# Patient Record
Sex: Female | Born: 1960
Health system: Southern US, Community
[De-identification: ages and names within clinical notes are randomized; demographics above are authoritative.]

## PROBLEM LIST (undated history)

## (undated) DIAGNOSIS — K449 Diaphragmatic hernia without obstruction or gangrene: Secondary | ICD-10-CM

## (undated) DIAGNOSIS — M1990 Unspecified osteoarthritis, unspecified site: Secondary | ICD-10-CM

## (undated) DIAGNOSIS — I313 Pericardial effusion (noninflammatory): Secondary | ICD-10-CM

## (undated) DIAGNOSIS — K219 Gastro-esophageal reflux disease without esophagitis: Secondary | ICD-10-CM

## (undated) DIAGNOSIS — D259 Leiomyoma of uterus, unspecified: Secondary | ICD-10-CM

## (undated) DIAGNOSIS — N281 Cyst of kidney, acquired: Secondary | ICD-10-CM

## (undated) DIAGNOSIS — N3281 Overactive bladder: Secondary | ICD-10-CM

## (undated) DIAGNOSIS — E785 Hyperlipidemia, unspecified: Secondary | ICD-10-CM

## (undated) DIAGNOSIS — K648 Other hemorrhoids: Secondary | ICD-10-CM

## (undated) HISTORY — DX: Hyperlipidemia, unspecified: E78.5

## (undated) HISTORY — DX: Diaphragmatic hernia without obstruction or gangrene: K44.9

## (undated) HISTORY — DX: Leiomyoma of uterus, unspecified: D25.9

## (undated) HISTORY — PX: WISDOM TOOTH EXTRACTION: SHX21

## (undated) HISTORY — PX: TONSILLECTOMY AND ADENOIDECTOMY: SHX28

## (undated) HISTORY — DX: Pericardial effusion (noninflammatory): I31.3

## (undated) HISTORY — DX: Overactive bladder: N32.81

## (undated) HISTORY — DX: Other hemorrhoids: K64.8

## (undated) HISTORY — DX: Gastro-esophageal reflux disease without esophagitis: K21.9

## (undated) HISTORY — PX: HEMORRHOID BANDING: SHX5850

## (undated) HISTORY — DX: Cyst of kidney, acquired: N28.1

## (undated) HISTORY — DX: Unspecified osteoarthritis, unspecified site: M19.90

## (undated) HISTORY — PX: COLONOSCOPY: SHX174

---

## 1997-10-20 ENCOUNTER — Other Ambulatory Visit: Admission: RE | Admit: 1997-10-20 | Discharge: 1997-10-20 | Payer: Self-pay | Admitting: Obstetrics and Gynecology

## 1999-02-10 ENCOUNTER — Other Ambulatory Visit: Admission: RE | Admit: 1999-02-10 | Discharge: 1999-02-10 | Payer: Self-pay | Admitting: Obstetrics and Gynecology

## 2000-03-28 ENCOUNTER — Other Ambulatory Visit: Admission: RE | Admit: 2000-03-28 | Discharge: 2000-03-28 | Payer: Self-pay | Admitting: Obstetrics and Gynecology

## 2001-07-23 ENCOUNTER — Other Ambulatory Visit: Admission: RE | Admit: 2001-07-23 | Discharge: 2001-07-23 | Payer: Self-pay | Admitting: Obstetrics and Gynecology

## 2002-02-11 ENCOUNTER — Encounter: Payer: Self-pay | Admitting: Obstetrics and Gynecology

## 2002-02-11 ENCOUNTER — Ambulatory Visit (HOSPITAL_COMMUNITY): Admission: RE | Admit: 2002-02-11 | Discharge: 2002-02-11 | Payer: Self-pay | Admitting: Obstetrics and Gynecology

## 2002-08-04 ENCOUNTER — Other Ambulatory Visit: Admission: RE | Admit: 2002-08-04 | Discharge: 2002-08-04 | Payer: Self-pay | Admitting: Obstetrics and Gynecology

## 2002-08-11 ENCOUNTER — Encounter: Admission: RE | Admit: 2002-08-11 | Discharge: 2002-08-11 | Payer: Self-pay | Admitting: Obstetrics and Gynecology

## 2002-08-11 ENCOUNTER — Encounter: Payer: Self-pay | Admitting: Obstetrics and Gynecology

## 2003-09-03 ENCOUNTER — Other Ambulatory Visit: Admission: RE | Admit: 2003-09-03 | Discharge: 2003-09-03 | Payer: Self-pay | Admitting: Obstetrics and Gynecology

## 2003-10-01 ENCOUNTER — Encounter: Admission: RE | Admit: 2003-10-01 | Discharge: 2003-10-01 | Payer: Self-pay | Admitting: Obstetrics and Gynecology

## 2004-10-14 ENCOUNTER — Other Ambulatory Visit: Admission: RE | Admit: 2004-10-14 | Discharge: 2004-10-14 | Payer: Self-pay | Admitting: Obstetrics and Gynecology

## 2005-03-09 ENCOUNTER — Encounter: Admission: RE | Admit: 2005-03-09 | Discharge: 2005-03-09 | Payer: Self-pay | Admitting: Obstetrics and Gynecology

## 2005-11-17 ENCOUNTER — Other Ambulatory Visit: Admission: RE | Admit: 2005-11-17 | Discharge: 2005-11-17 | Payer: Self-pay | Admitting: Obstetrics & Gynecology

## 2006-03-12 ENCOUNTER — Encounter: Admission: RE | Admit: 2006-03-12 | Discharge: 2006-03-12 | Payer: Self-pay | Admitting: Obstetrics and Gynecology

## 2006-12-11 ENCOUNTER — Other Ambulatory Visit: Admission: RE | Admit: 2006-12-11 | Discharge: 2006-12-11 | Payer: Self-pay | Admitting: Obstetrics and Gynecology

## 2007-03-20 ENCOUNTER — Encounter: Admission: RE | Admit: 2007-03-20 | Discharge: 2007-03-20 | Payer: Self-pay | Admitting: Obstetrics and Gynecology

## 2007-12-23 ENCOUNTER — Other Ambulatory Visit: Admission: RE | Admit: 2007-12-23 | Discharge: 2007-12-23 | Payer: Self-pay | Admitting: Obstetrics and Gynecology

## 2008-03-30 ENCOUNTER — Encounter: Payer: Self-pay | Admitting: Obstetrics and Gynecology

## 2008-03-30 ENCOUNTER — Ambulatory Visit (HOSPITAL_COMMUNITY): Admission: RE | Admit: 2008-03-30 | Discharge: 2008-03-30 | Payer: Self-pay | Admitting: Obstetrics and Gynecology

## 2008-03-30 HISTORY — PX: HYSTEROSCOPY: SHX211

## 2008-04-07 ENCOUNTER — Encounter: Admission: RE | Admit: 2008-04-07 | Discharge: 2008-04-07 | Payer: Self-pay | Admitting: Obstetrics and Gynecology

## 2009-04-30 ENCOUNTER — Encounter: Admission: RE | Admit: 2009-04-30 | Discharge: 2009-04-30 | Payer: Self-pay | Admitting: Obstetrics and Gynecology

## 2010-05-09 ENCOUNTER — Encounter
Admission: RE | Admit: 2010-05-09 | Discharge: 2010-05-09 | Payer: Self-pay | Source: Home / Self Care | Attending: Obstetrics and Gynecology | Admitting: Obstetrics and Gynecology

## 2010-08-23 NOTE — Op Note (Signed)
NAME:  Laurie Griffith, Laurie Griffith               ACCOUNT NO.:  [id number]   MEDICAL RECORD NO.:  [id number]          PATIENT TYPE:  AMB   LOCATION:  SDC                           FACILITY:  [facility]   PHYSICIAN:  Cynthia P. Romine, M.D.DATE OF BIRTH:  1960/11/05   DATE OF PROCEDURE:  DATE OF DISCHARGE:                               OPERATIVE REPORT   PREOPERATIVE DIAGNOSES:  Abnormal uterine bleeding, known endometrial  polyp on sonohysterogram.   POSTOPERATIVE DIAGNOSIS:  Abnormal uterine bleeding, known endometrial  polyp on sonohysterogram.   PATHOLOGY:  Pending.   PROCEDURE:  Hysteroscopic resection of polyps and D and C.   SURGEON:  Cynthia P. Romine, MD   ANESTHESIA:  General by LMA.   ESTIMATED BLOOD LOSS:  Minimal.   SORBITOL DEFICIT:  65 mL.   PROCEDURE:  The patient was taken to the operating room, and after  induction of adequate general anesthesia by LMA was placed in dorsal  lithotomy position and prepped and draped in the usual fashion.  The  bladder was drained with a red rubber catheter.  A posterior weighted  and anterior Sims retractor were placed.  The cervix was grasped on its  anterior lip with a single-tooth tenaculum.  The uterus sounded to 8 cm.  The cervix was dilated to a #31 Pratt.  The operative hysteroscope was  introduced.  Sorbitol was used as a distention medium, and the pump was  set on a pressure of 80 mmHg.  Multiple endometrial polyps were noted as  had been seen on sonohysterogram.  Photographic documentation was taken.  Single loop cautery was used to resect the polyp in several passes, and  the specimen sent to Pathology.  When the endometrial cavity appeared  clean, photographic documentation was taken.  The scope was withdrawn.  Gentle sharp curettage was carried out, and the specimen was sent  separately to Pathology.  The instruments were removed from the vagina,  and the procedure was terminated.  The patient went in satisfactory  condition to  postanesthesia recovery.      Cynthia P. Romine, M.D.  Electronically Signed     CPR/MEDQ  D:  03/30/2008  T:  03/30/2008  Job:  [id number]

## 2011-01-13 LAB — CBC
MCV: 91.8 fL (ref 78.0–100.0)
RBC: 4.21 MIL/uL (ref 3.87–5.11)
RDW: 13.3 % (ref 11.5–15.5)
WBC: 7.2 10*3/uL (ref 4.0–10.5)

## 2011-01-13 LAB — HCG, SERUM, QUALITATIVE: Preg, Serum: NEGATIVE

## 2011-04-10 ENCOUNTER — Other Ambulatory Visit: Payer: Self-pay | Admitting: Obstetrics and Gynecology

## 2011-04-10 DIAGNOSIS — Z1231 Encounter for screening mammogram for malignant neoplasm of breast: Secondary | ICD-10-CM

## 2011-05-15 ENCOUNTER — Ambulatory Visit
Admission: RE | Admit: 2011-05-15 | Discharge: 2011-05-15 | Disposition: A | Discharge status: Home / Self Care | Payer: BC Managed Care – PPO | Source: Ambulatory Visit | Attending: Obstetrics and Gynecology | Admitting: Obstetrics and Gynecology

## 2011-05-15 DIAGNOSIS — Z1231 Encounter for screening mammogram for malignant neoplasm of breast: Secondary | ICD-10-CM

## 2011-09-09 DIAGNOSIS — K219 Gastro-esophageal reflux disease without esophagitis: Secondary | ICD-10-CM

## 2011-09-09 HISTORY — DX: Gastro-esophageal reflux disease without esophagitis: K21.9

## 2011-09-29 ENCOUNTER — Other Ambulatory Visit: Payer: Self-pay | Admitting: Gastroenterology

## 2011-09-29 DIAGNOSIS — R140 Abdominal distension (gaseous): Secondary | ICD-10-CM

## 2011-09-29 DIAGNOSIS — R109 Unspecified abdominal pain: Secondary | ICD-10-CM

## 2011-10-05 ENCOUNTER — Ambulatory Visit
Admission: RE | Admit: 2011-10-05 | Discharge: 2011-10-05 | Disposition: A | Discharge status: Home / Self Care | Payer: BC Managed Care – PPO | Source: Ambulatory Visit | Attending: Gastroenterology | Admitting: Gastroenterology

## 2011-10-05 DIAGNOSIS — R140 Abdominal distension (gaseous): Secondary | ICD-10-CM

## 2011-10-05 DIAGNOSIS — R109 Unspecified abdominal pain: Secondary | ICD-10-CM

## 2011-10-06 ENCOUNTER — Other Ambulatory Visit: Payer: BC Managed Care – PPO

## 2011-10-09 ENCOUNTER — Other Ambulatory Visit (HOSPITAL_COMMUNITY): Payer: Self-pay | Admitting: Gastroenterology

## 2011-10-09 ENCOUNTER — Other Ambulatory Visit (HOSPITAL_COMMUNITY): Payer: Self-pay

## 2011-10-09 DIAGNOSIS — R1084 Generalized abdominal pain: Secondary | ICD-10-CM

## 2011-10-13 ENCOUNTER — Other Ambulatory Visit (HOSPITAL_COMMUNITY): Payer: BC Managed Care – PPO

## 2011-10-18 ENCOUNTER — Other Ambulatory Visit: Payer: Self-pay | Admitting: Gastroenterology

## 2011-11-12 ENCOUNTER — Telehealth: Payer: Self-pay

## 2011-11-12 ENCOUNTER — Ambulatory Visit: Payer: BC Managed Care – PPO | Admitting: Family Medicine

## 2011-11-12 VITALS — BP 109/77 | HR 76 | Temp 98.4°F | Resp 16 | Ht 64.0 in | Wt 101.0 lb

## 2011-11-12 DIAGNOSIS — R21 Rash and other nonspecific skin eruption: Secondary | ICD-10-CM

## 2011-11-12 MED ORDER — DOXYCYCLINE HYCLATE 100 MG PO TABS: 100.0000 mg | ORAL_TABLET | Freq: Two times a day (BID) | ORAL | Status: AC

## 2011-11-12 MED ORDER — PREDNISONE 20 MG PO TABS: ORAL_TABLET | ORAL | Status: AC

## 2011-11-12 NOTE — Progress Notes (Addendum)
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 She was in [place] 2 weeks ago had a bug bite and it was evaluated at an urgent care and thought to be just that, but bite. She also had to take penicillin for 10 days which ended 2 days ago.  She's had problems with seborrheic dermatitis and reflux in the past.  Objective: Patient has a diffuse urticarial rash over her torso and extremities. The red papules extend to the distal wrist it seemed to spare the palms. She does have a few papules in the interdigital spaces of her hands.  Assessment: Urticarial type rash with abrupt onset possibly related to the penicillin but also possibly related to an insect bite-like attack 2 weeks ago.  Plan: 1. Rash  Rocky mtn spotted fvr abs pnl(IgG+IgM), B. burgdorfi antibodies, doxycycline (VIBRA-TABS) 100 MG tablet, predniSONE (DELTASONE) 20 MG tablet

## 2011-11-12 NOTE — Telephone Encounter (Signed)
Spoke with pt and she is worried about the high dosage on the prednisone. She went on a website and it stated 40 mg dose is a very large dose and just wanted to make sure this is ok for her to take. She thinks maybe 10 mg a day would be ok but just wanted Dr L to advise

## 2011-11-12 NOTE — Telephone Encounter (Signed)
Pt called and I spoke with her regarding her concerns about her prednisone dose.  She is worried that the 40mg  for 5 days is to much.  I discussed with patient that she should be ok but pt really felt more comfortable with a taper so we decided to change to 40-40-30-30-20-20-10-10.  I felt that this would probably help the patients rash and ease her concerns.  If you feel differently please contact patient.

## 2011-11-12 NOTE — Patient Instructions (Signed)
Ehrlichiosis and Anaplasmosis Ehrlichiosis and anaplasmosis are diseases caused by bacteria and carried by ticks. Other names for these infections are:  Human monocytic ehrlichiosis (HME).   Human granulocytotropic anaplasmosis (HGA).  HME mostly occurs in the [place] and [place], where the lone star tick lives. However, infections have occurred in 47 states. HGA infections are limited to fewer geographic locations. Most cases are reported from [place], [place], [place], [place], and [place]. This distribution is almost identical to that of Lyme disease because of the shared species of ixodid ticks (wood ticks, deer ticks). CAUSES   HME is caused by Ehrlichia chaffeensis and other closely related ehrlichia bacteria.   HGA is caused by the bacteria Anaplasma phagocytophilum.  An infected adult tick transmits the infection by biting a human. Once a tick gains access to human skin, it generally climbs upward until it reaches a more protected area. This is often the back of the knee, groin, navel, armpit, ears, or nape of the neck. It then begins the slow process of embedding itself in the skin. Adult ticks are active during warmer times of the year. For this reason, most infections occur between late spring and early fall. SYMPTOMS  Many infected people have no symptoms. For those with symptoms, HME and HGA cause similar illnesses. Symptoms typically begin 1 week or more after a tick bite and may include:  Fever.   Headache.   Chills or shaking.   Fatigue.   Muscle pain.   Nausea.   Loss of appetite.   Vomiting.   Diarrhea.  Symptoms commonly last for 1 to 3 weeks if a patient is not diagnosed or not treated with an antibiotic. Extremely severe disease is rare, but occasional deaths from infection have been reported. DIAGNOSIS  Diagnosis is suggested by a history of tick bites or potential exposure to ticks. Blood tests may show  abnormalities of liver function and low counts of white blood cells and platelets. To confirm the diagnosis, the bacteria must be found in a smear of blood on a microscope slide or during testing of the liquid part of your blood (serum). TREATMENT  Treatment with an antibiotic is almost always effective in eliminating symptoms within a couple days and curing the infection.  PREVENTION Ticks prefer to hide in shady, moist ground. However, they can often be found above the ground clinging to tall grass, brush, shrubs, and low tree branches. They also inhabit lawns and gardens, especially at the edges of woodlands and around old stone walls. Within the normal geographic areas where HME and HGA occur, no vegetated area can be considered completely free of infected ticks. In tick-infested areas, the best precaution against infection is to avoid contact with soil, leaf litter, and vegetation as much as possible. Campers, hikers, field workers, and others who spend time in wooded, brushy, or tall grassy areas can avoid exposure to ticks by using the following precautions:  Wear light-colored clothing with a tight weave to spot ticks more easily and prevent contact with the skin.   Wear long pants tucked into socks, long sleeve shirts tucked into pants, and enclosed shoes or boots.   Use insect repellent. Spray clothes with insect repellent containing either DEET or permethrin. Only DEET can be used on exposed skin. Make sure to follow the manufacturer's directions carefully.   Wear a hat and keep long hair pulled back.   Stay on cleared, well-worn trails whenever possible.   Check yourself and others frequently  for the presence of ticks on clothes. If you find one tick, there may be more. Check thoroughly.   Remove clothes after leaving tick-infested areas and, if possible, wash them to eliminate any unseen ticks. Check yourself, your children, and any pets from head to toe for the presence of ticks.    When attached ticks are found, you can greatly reduce your chances of getting HME and HGA if you remove them as soon as possible. Use a tweezer to grab hold of the tick by its mouth parts and pull it off.   Shower and shampoo after possible exposure to ticks.  HOME CARE INSTRUCTIONS Take your antibiotics as directed. Finish them even if you start to feel better. SEEK MEDICAL CARE IF:   You have a fever.   You develop a headache.   You develop fatigue.   You develop muscle pain.   You develop nausea, vomiting, or diarrhea.  MAKE SURE YOU:  Understand these instructions.   Will watch your condition.   Will get help right away if you are not doing well or get worse.  Document Released: 03/24/2000 Document Revised: 03/16/2011 Document Reviewed: 09/30/2010 [facility] Patient Information 2012 [place], [place].Deer Tick Bite Deer ticks are brown arachnids (spider family) that vary in size from as small as the head of a pin to 1/4 inch (1/2 cm) diameter. They thrive in wooded areas. Deer are the preferred host of adult deer ticks. Small rodents are the host of young ticks (nymphs). When a person walks in a field or wooded area, young and adult ticks in the surrounding grass and vegetation can attach themselves to the skin. They can suck blood for hours to days if unnoticed. Ticks are found all over the [place] Some ticks carry a specific bacteria (Borrelia burgdorferi) that causes an infection called Lyme disease. The bacteria is typically passed into a person during the blood sucking process. This happens after the tick has been attached for at least a number of hours. While ticks can be found all over the [place] those carrying the bacteria that causes Lyme disease are most common in [place] and the [place]. Only a small proportion of ticks in these areas carry the Lyme disease bacteria and cause human infections. Ticks usually attach to warm spots on the body, such as the:  Head.   Back.    Neck.   Armpits.   Groin.  SYMPTOMS  Most of the time, a deer tick bite will not be felt. You may or may not see the attached tick. You may notice mild irritation or redness around the bite site. If the deer tick passes the Lyme disease bacteria to a person, a round, red rash may be noticed 2 to 3 days after the bite. The rash may be clear in the middle, like a bull's-eye or target. If not treated, other symptoms may develop several days to weeks after the onset of the rash. These symptoms may include:  New rash lesions.   Fatigue and weakness.   General ill feeling and achiness.   Chills.   Headache and neck pain.   Swollen lymph glands.   Sore muscles and joints.  5 to 15% of untreated people with Lyme disease may develop more severe illnesses after several weeks to months. This may include inflammation of the brain lining (meningitis), nerve palsies, an abnormal heartbeat, or severe muscle and joint pain and inflammation (myositis or arthritis). DIAGNOSIS   Physical exam and medical history.   Viewing  the tick if it was saved for confirmation.   Blood tests (to check or confirm the presence of Lyme disease).  TREATMENT  Most ticks do not carry disease. If found, an attached tick should be removed using tweezers. Tweezers should be placed under the body of the tick so it is removed by its attachment parts (pincers). If there are signs or symptoms of being sick, or Lyme disease is confirmed, medicines (antibiotics) that kill germs are usually prescribed. In more severe cases, antibiotics may be given through an intravenous (IV) access. HOME CARE INSTRUCTIONS   Always remove ticks with tweezers. Do not use petroleum jelly or other methods to kill or remove the tick. Slide the tweezers under the body and pull out as much as you can. If you are not sure what it is, save it in a jar and show your caregiver.   Once you remove the tick, the skin will heal on its own. Wash your  hands and the affected area with water and soap. You may place a bandage on the affected area.   Take medicine as directed. You may be advised to take a full course of antibiotics.   Follow up with your caregiver as recommended.  FINDING OUT THE RESULTS OF YOUR TEST Not all test results are available during your visit. If your test results are not back during the visit, make an appointment with your caregiver to find out the results. Do not assume everything is normal if you have not heard from your caregiver or the medical facility. It is important for you to follow up on all of your test results. PROGNOSIS  If Lyme disease is confirmed, early treatment with antibiotics is very effective. Following preventive guidelines is important since it is possible to get the disease more than once. PREVENTION   Wear long sleeves and long pants in wooded or grassy areas. Tuck your pants into your socks.   Use an insect repellent while hiking.   Check yourself, your children, and your pets regularly for ticks after playing outside.   Clear piles of leaves or brush from your yard. Ticks might live there.  SEEK MEDICAL CARE IF:   You or your child has an oral temperature above 102 F (38.9 C).   You develop a severe headache following the bite.   You feel generally ill.   You notice a rash.   You are having trouble removing the tick.   The bite area has red skin or yellow drainage.  SEEK IMMEDIATE MEDICAL CARE IF:   Your face is weak and droopy or you have other neurological symptoms.   You have severe joint pain or weakness.  MAKE SURE YOU:   Understand these instructions.   Will watch your condition.   Will get help right away if you are not doing well or get worse.  FOR MORE INFORMATION Centers for Disease Control and Prevention: FootballExhibition.com.br American Academy [employer]: www.https://powers.com/ Document Released: 06/21/2009 Document Revised: 03/16/2011 Document Reviewed:  06/21/2009 [facility] Patient Information 2012 [place], [place].Wood Tick Bite Ticks are insects that attach themselves to the skin. Most tick bites are harmless, but sometimes ticks carry diseases that can make a person quite ill. The chance of getting ill depends on:  The kind of tick that bites you.   Time of year.   How long the tick is attached.   Geographic location.  Wood ticks are also called dog ticks. They are generally black. They can have white markings.  They live in shrubs and grassy areas. They are larger than deer ticks. Wood ticks are about the size of a watermelon seed. They have a hard body. The most common places for ticks to attach themselves are the scalp, neck, armpits, waist, and groin. Wood ticks may stay attached for up to 2 weeks. TICKS MUST BE REMOVED AS SOON AS POSSIBLE TO HELP PREVENT DISEASES CAUSED BY TICK BITES.  TO REMOVE A TICK: 1. If available, put on latex gloves before trying to remove a tick.  2. Grasp the tick as close to the skin as possible, with curved forceps, fine tweezers or a special tick removal tool.  3. Pull gently with steady pressure until the tick lets go. Do not twist the tick or jerk it suddenly. This may break off the tick's head or mouth parts.  4. Do not crush the tick's body. This could force disease-carrying fluids from the tick into your body.  5. After the tick is removed, wash the bite area and your hands with soap and water or other disinfectant.  6. Apply a small amount of antiseptic cream or ointment to the bite site.  7. Wash and disinfect any instruments that were used.  8. Save the tick in a jar or plastic bag for later identification. Preserve the tick with a bit of alcohol or put it in the freezer.  9. Do not apply a hot match, petroleum jelly, or fingernail polish to the tick. This does not work and may increase the chances of disease from the tick bite.  YOU MAY NEED TO SEE YOUR CAREGIVER FOR A TETANUS SHOT NOW IF:  You  have no idea when you had the last one.   You have never had a tetanus shot before.  If you need a tetanus shot, and you decide not to get one, there is a rare chance of getting tetanus. Sickness from tetanus can be serious. If you get a tetanus shot, your arm may swell, get red and warm to the touch at the shot site. This is common and not a problem. PREVENTION  Wear protective clothing. Long sleeves and pants are best.   Wear white clothes to see ticks more easily   Tuck your pant legs into your socks.   If walking on trail, stay in the middle of the trail to avoid brushing against bushes.   Put insect repellent on all exposed skin and along boot tops, pant legs and sleeve cuffs   Check clothing, hair and skin repeatedly and before coming inside.   Brush off any ticks that are not attached.  SEEK MEDICAL CARE IF:   You cannot remove a tick or part of the tick that is left in the skin.   Unexplained fever.   Redness and swelling in the area of the tick bite.   Tender, swollen lymph glands.   Diarrhea.   Weight loss.   Cough.   Fatigue.   Muscle, joint or bone pain.   Belly pain.   Headache.   Rash.  SEEK IMMEDIATE MEDICAL CARE IF:   You develop an oral temperature above 102 F (38.9 C).   You are having trouble walking or moving your legs.   Numbness in the legs.   Shortness of breath.   Confusion.   Repeated vomiting.  Document Released: 03/24/2000 Document Revised: 03/16/2011 Document Reviewed: 03/02/2008 [facility] Patient Information 2012 [place], [place].

## 2011-11-12 NOTE — Telephone Encounter (Signed)
Pt saw dr Milus Glazier this morning and was given prednisone and the milligrams please call patient    Best number [phone]  dfb

## 2011-11-13 LAB — B. BURGDORFI ANTIBODIES: B burgdorferi Ab IgG+IgM: 0.4 {ISR}

## 2011-11-13 LAB — ROCKY MTN SPOTTED FVR ABS PNL(IGG+IGM)
RMSF IgG: 0.14 IV
RMSF IgM: 0.3 IV

## 2011-11-13 NOTE — Telephone Encounter (Signed)
Patient called to request results of labs done on 11/12/11.  The patient stated that she would like to know the results as soon as possible so that if an antibiotic is needed she can start taking it as soon as possible.  Please call the patient at [phone].

## 2011-11-13 NOTE — Telephone Encounter (Signed)
Patient notified lab results and is doing better.

## 2011-11-13 NOTE — Telephone Encounter (Signed)
Pt calling about lab results [phone] and its  Ok to leave a message.Marland Kitchen

## 2012-02-06 ENCOUNTER — Ambulatory Visit (INDEPENDENT_AMBULATORY_CARE_PROVIDER_SITE_OTHER): Payer: BC Managed Care – PPO | Admitting: Family Medicine

## 2012-02-06 VITALS — BP 120/76 | HR 68 | Temp 98.5°F | Resp 16 | Ht 63.0 in | Wt 103.0 lb

## 2012-02-06 DIAGNOSIS — R002 Palpitations: Secondary | ICD-10-CM

## 2012-02-06 DIAGNOSIS — R079 Chest pain, unspecified: Secondary | ICD-10-CM

## 2012-02-06 DIAGNOSIS — K219 Gastro-esophageal reflux disease without esophagitis: Secondary | ICD-10-CM

## 2012-02-06 LAB — POCT CBC
Granulocyte percent: 63.2 % (ref 37–80)
HCT, POC: 39.1 % (ref 37.7–47.9)
Hemoglobin: 11.9 g/dL — AB (ref 12.2–16.2)
Lymph, poc: 3.4 (ref 0.6–3.4)
MCH, POC: 28.9 pg (ref 27–31.2)
MCHC: 30.4 g/dL — AB (ref 31.8–35.4)
MCV: 94.8 fL (ref 80–97)
MID (cbc): 0.5 (ref 0–0.9)
MPV: 13.3 fL (ref 0–99.8)
POC Granulocyte: 6.8 (ref 2–6.9)
POC LYMPH PERCENT: 32 % (ref 10–50)
POC MID %: 4.8 %M (ref 0–12)
Platelet Count, POC: 213 10*3/uL (ref 142–424)
RBC: 4.12 M/uL (ref 4.04–5.48)
RDW, POC: 13.5 %
WBC: 10.7 10*3/uL — AB (ref 4.6–10.2)

## 2012-02-06 NOTE — Progress Notes (Signed)
[facility]:  Office Visit  Chief Complaint:  Chief Complaint  Patient presents with  . Palpitations    HPI: Laurie Griffith is a 51 y.o. female who complains of  Acute midepigastric chest pain which started this AM while at rest, describes it as chest pressure/heaviness, would not describe it as "pain." No radiation. She had some palpitations with this. She has a h/o severe GERD which has been extensively worked up by Dr. Shela Leff at [facility].  She also has had associated palpitations about 3-4 days prior to her acute onset of CP. She attributes this to eating too much and too late at night.  She was on several different meds for GERD but has been off of them, she is getting better with her GERD sxs except for today. She was on PPI and took herself off oof it since she has changed her diet. She attributes the GERD sxs to this salad mix that she eats daily for the last 4 years, since she has stopped eating it her GERD sxs have decreased. She denies SOB, diaphoresis, HA, Nausea, vomiting, abd pain. She has no h/o of HTN, XOL, or DM. Denies family history of premature MIs. Father was dx with Atrial fib in his 40s. She has good HDL according to her.   Past Medical History  Diagnosis Date  . GERD (gastroesophageal reflux disease)    History reviewed. No pertinent past surgical history. History   Social History  . Marital Status: Married    Spouse Name: N/A    Number of Children: N/A  . Years of Education: N/A   Social History Main Topics  . Smoking status: Never Smoker   . Smokeless tobacco: None  . Alcohol Use: Yes  . Drug Use: No  . Sexually Active: None   Other Topics Concern  . None   Social History Narrative  . None   Family History  Problem Relation Age of Onset  . Atrial fibrillation Father    Allergies  Allergen Reactions  . Penicillins Rash  . Sulfa Antibiotics Rash   Prior to Admission medications   Medication Sig Start Date End Date  Taking? Authorizing Provider  loratadine (CLARITIN) 10 MG tablet Take 10 mg by mouth daily.   Yes Historical Provider, MD  metroNIDAZOLE (METROGEL) 1 % gel Apply topically daily.   Yes Historical Provider, MD  norgestimate-ethinyl estradiol (ORTHO-CYCLEN,SPRINTEC,PREVIFEM) 0.25-35 MG-MCG tablet Take 1 tablet by mouth daily.   Yes Historical Provider, MD  fluticasone (CUTIVATE) 0.05 % cream Apply topically 2 (two) times daily.    Historical Provider, MD     ROS: The patient denies fevers, chills, night sweats, unintentional weight loss, wheezing, dyspnea on exertion, nausea, vomiting, abdominal pain, dysuria, hematuria, melena, numbness, weakness, or tingling. + chest pain, palpitations,   All other systems have been reviewed and were otherwise negative with the exception of those mentioned in the HPI and as above.    PHYSICAL EXAM: Filed Vitals:   02/06/12 1908  BP: 120/76  Pulse: 68  Temp: 98.5 F (36.9 C)  Resp: 16   Filed Vitals:   02/06/12 1908  Height: 5\' 3"  (1.6 m)  Weight: 103 lb (46.72 kg)   Body mass index is 18.25 kg/(m^2).  General: Alert, no acute distress HEENT:  Normocephalic, atraumatic, oropharynx patent. EOMI, PERRLA Cardiovascular:  Regular rate and rhythm, no rubs murmurs or gallops.  No Carotid bruits, radial pulse intact. No pedal edema.  Respiratory: Clear to auscultation bilaterally.  No wheezes, rales, or rhonchi.  No cyanosis, no use of accessory musculature GI: No organomegaly, abdomen is soft and non-tender, positive bowel sounds.  No masses. Skin: No rashes. Neurologic: Facial musculature symmetric. Psychiatric: Patient is appropriate throughout our interaction. Lymphatic: No cervical lymphadenopathy Musculoskeletal: Gait intact.   LABS: Results for orders placed in visit on 02/06/12  POCT CBC      Component Value Range   WBC 10.7 (*) 4.6 - 10.2 K/uL   Lymph, poc 3.4  0.6 - 3.4   POC LYMPH PERCENT 32.0  10 - 50 %L   MID (cbc) 0.5  0 - 0.9    POC MID % 4.8  0 - 12 %M   POC Granulocyte 6.8  2 - 6.9   Granulocyte percent 63.2  37 - 80 %G   RBC 4.12  4.04 - 5.48 M/uL   Hemoglobin 11.9 (*) 12.2 - 16.2 g/dL   HCT, POC 40.9  81.1 - 47.9 %   MCV 94.8  80 - 97 fL   MCH, POC 28.9  27 - 31.2 pg   MCHC 30.4 (*) 31.8 - 35.4 g/dL   RDW, POC 91.4     Platelet Count, POC 213  142 - 424 K/uL   MPV 13.3  0 - 99.8 fL     EKG/XRAY:   Primary read interpreted by Dr. Conley Rolls at [facility]. EKG NSR 61 bpm, no ST elevation/depression   ASSESSMENT/PLAN: Encounter Diagnoses  Name Primary?  . Chest pain Yes  . Palpitations   . GERD (gastroesophageal reflux disease)    Most likely GERD sxs.She has no risk factors for heart disease. No HTN, XOL, DM. Denies nausea, vomiting. abd pain. Weakness, numbness tingling Will defer CXR since vitals all stable, patient not SOB Labs pending-TSH, CMP, H. Pylori D/w pt minimally elevated WBC and also minimally decreased Hgb. I am not concerned at this time with these numbers. IF she has worsenign sxs then need to go to ER ASAP.     Hamilton Capri PHUONG, DO 02/06/2012 7:57 PM

## 2012-02-07 LAB — COMPREHENSIVE METABOLIC PANEL WITH GFR
ALT: 11 U/L (ref 0–35)
AST: 19 U/L (ref 0–37)
Albumin: 3.9 g/dL (ref 3.5–5.2)
Alkaline Phosphatase: 35 U/L — ABNORMAL LOW (ref 39–117)
Calcium: 9.4 mg/dL (ref 8.4–10.5)
Sodium: 137 meq/L (ref 135–145)
Total Protein: 6.3 g/dL (ref 6.0–8.3)

## 2012-02-07 LAB — COMPREHENSIVE METABOLIC PANEL
BUN: 19 mg/dL (ref 6–23)
CO2: 26 mEq/L (ref 19–32)
Chloride: 103 mEq/L (ref 96–112)
Creat: 0.83 mg/dL (ref 0.50–1.10)
Glucose, Bld: 97 mg/dL (ref 70–99)
Potassium: 3.9 mEq/L (ref 3.5–5.3)
Total Bilirubin: 0.4 mg/dL (ref 0.3–1.2)

## 2012-02-07 LAB — TSH: TSH: 1.617 u[IU]/mL (ref 0.350–4.500)

## 2012-02-08 LAB — H. PYLORI ANTIBODY, IGG: H Pylori IgG: 0.51 {ISR}

## 2012-02-09 ENCOUNTER — Telehealth: Payer: Self-pay | Admitting: Family Medicine

## 2012-02-09 NOTE — Telephone Encounter (Signed)
LM regarding labs °

## 2012-02-18 ENCOUNTER — Encounter: Payer: Self-pay | Admitting: Family Medicine

## 2012-02-19 ENCOUNTER — Encounter: Payer: Self-pay | Admitting: Family Medicine

## 2012-05-14 ENCOUNTER — Other Ambulatory Visit: Payer: Self-pay | Admitting: Obstetrics and Gynecology

## 2012-05-14 DIAGNOSIS — Z1231 Encounter for screening mammogram for malignant neoplasm of breast: Secondary | ICD-10-CM

## 2012-05-25 ENCOUNTER — Other Ambulatory Visit: Payer: Self-pay

## 2012-06-10 ENCOUNTER — Ambulatory Visit
Admission: RE | Admit: 2012-06-10 | Discharge: 2012-06-10 | Disposition: A | Discharge status: Home / Self Care | Payer: BC Managed Care – PPO | Source: Ambulatory Visit | Attending: Obstetrics and Gynecology | Admitting: Obstetrics and Gynecology

## 2012-06-10 DIAGNOSIS — Z1231 Encounter for screening mammogram for malignant neoplasm of breast: Secondary | ICD-10-CM

## 2012-09-30 ENCOUNTER — Ambulatory Visit: Payer: BC Managed Care – PPO | Admitting: Family Medicine

## 2012-10-23 ENCOUNTER — Ambulatory Visit: Payer: BC Managed Care – PPO | Admitting: Sports Medicine

## 2012-11-25 ENCOUNTER — Ambulatory Visit: Payer: BC Managed Care – PPO | Admitting: Nurse Practitioner

## 2012-12-02 ENCOUNTER — Ambulatory Visit (INDEPENDENT_AMBULATORY_CARE_PROVIDER_SITE_OTHER): Payer: BC Managed Care – PPO | Admitting: Nurse Practitioner

## 2012-12-02 ENCOUNTER — Encounter: Payer: Self-pay | Admitting: Nurse Practitioner

## 2012-12-02 VITALS — BP 100/60 | HR 60 | Resp 16 | Ht 63.5 in | Wt 103.0 lb

## 2012-12-02 DIAGNOSIS — Z01419 Encounter for gynecological examination (general) (routine) without abnormal findings: Secondary | ICD-10-CM

## 2012-12-02 DIAGNOSIS — Z0000 Encounter for general adult medical examination without abnormal findings: Secondary | ICD-10-CM

## 2012-12-02 LAB — POCT URINALYSIS DIPSTICK
Bilirubin, UA: NEGATIVE
Glucose, UA: NEGATIVE
Ketones, UA: NEGATIVE
Protein, UA: NEGATIVE

## 2012-12-02 MED ORDER — NORGESTIM-ETH ESTRAD TRIPHASIC 0.18/0.215/0.25 MG-25 MCG PO TABS: 1.0000 | ORAL_TABLET | Freq: Every day | ORAL | Status: DC

## 2012-12-02 NOTE — Progress Notes (Signed)
Patient ID: Laurie Griffith, female   DOB: [birth year], 52 y.o.   MRN: [id number] 52 y.o. G0P0. Married Caucasian Fe here for annual exam.  Menses is usually moderate to light for 3 days. No vaso symptoms.  Patient's last menstrual period was 11/26/2012.          Sexually active: yes  The current method of family planning is OCP (estrogen/progesterone).    Exercising: yes  waliking and elliptical 7 days a week Smoker:  no  Health Maintenance: Pap:  11/20/11, WNL, neg HR HPV MMG:  07/18/12, normal Colonoscopy:  2012, repeat in 10 years BMD:   never TDaP:  11/2011 ? Labs: HB: PCP Urine:    reports that she has never smoked. She does not have any smokeless tobacco history on file. She reports that  drinks alcohol. She reports that she does not use illicit drugs.  Past Medical History  Diagnosis Date  . GERD (gastroesophageal reflux disease)     No past surgical history on file.  Current Outpatient Prescriptions  Medication Sig Dispense Refill  . fluticasone (CUTIVATE) 0.05 % cream Apply topically 2 (two) times daily.      Marland Kitchen loratadine (CLARITIN) 10 MG tablet Take 10 mg by mouth daily.      . metroNIDAZOLE (METROGEL) 1 % gel Apply topically daily.      . norgestimate-ethinyl estradiol (ORTHO-CYCLEN,SPRINTEC,PREVIFEM) 0.25-35 MG-MCG tablet Take 1 tablet by mouth daily.       No current facility-administered medications for this visit.    Family History  Problem Relation Age of Onset  . Atrial fibrillation Father     ROS:  Pertinent items are noted in HPI.  Otherwise, a comprehensive ROS was negative.  Exam:   LMP 11/26/2012    Ht Readings from Last 3 Encounters:  02/06/12 5\' 3"  (1.6 m)  11/12/11 5\' 4"  (1.626 m)    General appearance: alert, cooperative and appears stated age Head: Normocephalic, without obvious abnormality, atraumatic Neck: no adenopathy, supple, symmetrical, trachea midline and thyroid normal to inspection and palpation Lungs: clear to auscultation  bilaterally Breasts: normal appearance, no masses or tenderness Heart: regular rate and rhythm Abdomen: soft, non-tender; no masses,  no organomegaly Extremities: extremities normal, atraumatic, no cyanosis or edema Skin: Skin color, texture, turgor normal. No rashes or lesions Lymph nodes: Cervical, supraclavicular, and axillary nodes normal. No abnormal inguinal nodes palpated Neurologic: Grossly normal   Pelvic: External genitalia:  no lesions              Urethra:  normal appearing urethra with no masses, tenderness or lesions              Bartholin's and Skene's: normal                 Vagina: normal appearing vagina with normal color and discharge, no lesions              Cervix: anteverted              Pap taken: no Bimanual Exam:  Uterus:  normal size, contour, position, consistency, mobility, non-tender              Adnexa: no mass, fullness, tenderness               Rectovaginal: Confirms               Anus:  normal sphincter tone, no lesions  A:  Well Woman with normal exam  Perimenopausal  Contraception  P:  Pap smear as per guidelines   Mammogram due 07/2013  Refill Ortho Lo for a year  Discussed coming off OCP next year  Counseled on breast self exam, adequate intake of calcium and vitamin D, diet and exercise return annually or prn  An After Visit Summary was printed and given to the patient.

## 2012-12-02 NOTE — Patient Instructions (Signed)

## 2012-12-03 NOTE — Progress Notes (Signed)
Encounter reviewed by Dr. Brook Silva.  

## 2013-02-13 ENCOUNTER — Other Ambulatory Visit: Payer: Self-pay

## 2013-04-07 ENCOUNTER — Telehealth: Payer: Self-pay | Admitting: Nurse Practitioner

## 2013-04-07 NOTE — Telephone Encounter (Signed)
Patient states she talked with Patty about coming off OCP and at the time patient was not interested.  Advised she may stop OCP if desired and is good to do it at end of pack to prevent BTB. Patient asking about what to expect when coming off pills. Advised she should see if her issues with her feet and sleep improve when off pills. Also advised that may experience menopausal symptoms that she has not had to deal with since was on OCP.  Advised may want to discuss this with Patty to see if this is best action or possible transition to HRT.  Consult tomm 04-08-13 at 1245 with Patty. Routing to provider for final review. Patient agreeable to disposition. Will close encounter

## 2013-04-07 NOTE — Telephone Encounter (Signed)
Patient  sees Ulice Bold and they have discussed having her go off pills, she has an appointment in August. She is calling to find out if it would be advisable to stop BC pills now since she is on her last week of the pills. Also wants to discuss symptoms: itchy feet, interruption of sleep.

## 2013-04-07 NOTE — Telephone Encounter (Signed)
LMTCB

## 2013-04-08 ENCOUNTER — Encounter: Payer: Self-pay | Admitting: Nurse Practitioner

## 2013-04-08 ENCOUNTER — Ambulatory Visit (INDEPENDENT_AMBULATORY_CARE_PROVIDER_SITE_OTHER): Payer: BC Managed Care – PPO | Admitting: Nurse Practitioner

## 2013-04-08 VITALS — BP 100/66 | HR 60 | Ht 63.5 in | Wt 103.0 lb

## 2013-04-08 DIAGNOSIS — N951 Menopausal and female climacteric states: Secondary | ICD-10-CM

## 2013-04-08 NOTE — Progress Notes (Signed)
Subjective:     Patient ID: Dia Sitter, female   DOB: [birth year], 52 y.o.   MRN: [id number]  HPI  This 52 yo WM Fe presents to discuss going off OCP.  She was planning to discontinue at her next Birthday as discussed sat her AEX last August.  Since then she has developed 'itching of her feet' that occurs 2-3 times per week.  She has done some online research and there is reference to symptoms being related to an increase in estrogen that causes this problem.  She has noted the problem is better when on the off week and much worse the first week back on active pills.  She denies any sort of rash between the toes, no scaling or signs of inflammation.  There is no burning or numbness of the feet.  She can treat with moisturizer and that does seem to help.  If she goes off OCP, she is also worried about pregnancy issues and about mood changes.  Seems as though her husband is going through some personal issues and that is enough that can set her mood off.  He is home now every day which compounds her dealing with him.  She has had normal CMP including glucose levels.   Review of Systems  Constitutional: Negative for diaphoresis and appetite change.  HENT: Negative.   Respiratory: Negative.   Cardiovascular: Negative.   Gastrointestinal: Negative.   Endocrine: Negative for cold intolerance, heat intolerance, polydipsia, polyphagia and polyuria.  Genitourinary: Negative.        Menses is regular on OCP  Neurological: Negative.   Psychiatric/Behavioral: Negative.        Objective:   Physical Exam  Constitutional: She is oriented to person, place, and time. She appears well-developed and well-nourished. No distress.  Exam is not indicated today  Neurological: She is alert and oriented to person, place, and time.  Psychiatric: She has a normal mood and affect. Her behavior is normal. Judgment and thought content normal.       Assessment:     Perimenopausal symptoms 'Itchy feet" - unknown  cause - not sure if related to OCP    Plan:     We have discussed pro's and cons of stopping OCP. Would expect her to have maybe the first month of a cycle off OCP.  Afterwards she may become irregular or amenorrhea.  But this would give her time to see how she did with the 'itchy feet'.  If she did develop menopausal symptoms she could easily transition into HRT.  Discussed WHI study with her and potential side effects including DVT, CVA, cancer, etc.  She has a month of OCP at home - she will decide to do the trial now or next month.  She feels comfortable with need to start HRT if needed.  Information given to her about menopause. She is also aware that if no menses in 3 months to call back when she is off OCP.

## 2013-04-08 NOTE — Progress Notes (Signed)
Encounter reviewed by Dr. Brook Silva.  

## 2013-04-08 NOTE — Patient Instructions (Signed)

## 2013-12-08 ENCOUNTER — Ambulatory Visit (INDEPENDENT_AMBULATORY_CARE_PROVIDER_SITE_OTHER): Payer: BC Managed Care – PPO | Admitting: Nurse Practitioner

## 2013-12-08 ENCOUNTER — Encounter: Payer: Self-pay | Admitting: Nurse Practitioner

## 2013-12-08 VITALS — BP 100/66 | HR 60 | Ht 63.5 in | Wt 99.0 lb

## 2013-12-08 DIAGNOSIS — Z0000 Encounter for general adult medical examination without abnormal findings: Secondary | ICD-10-CM

## 2013-12-08 DIAGNOSIS — Z01419 Encounter for gynecological examination (general) (routine) without abnormal findings: Secondary | ICD-10-CM

## 2013-12-08 LAB — POCT URINALYSIS DIPSTICK
Bilirubin, UA: NEGATIVE
Blood, UA: NEGATIVE
GLUCOSE UA: NEGATIVE
Ketones, UA: NEGATIVE
Leukocytes, UA: NEGATIVE
NITRITE UA: NEGATIVE
PROTEIN UA: NEGATIVE
UROBILINOGEN UA: NEGATIVE
pH, UA: >=9

## 2013-12-08 MED ORDER — MEDROXYPROGESTERONE ACETATE 10 MG PO TABS: 10.0000 mg | ORAL_TABLET | Freq: Every day | ORAL | Status: DC

## 2013-12-08 NOTE — Progress Notes (Signed)
Patient ID: Laurie Griffith, female   DOB: 1960-08-25, 53 y.o.   MRN: [id number] 53 y.o. G0P0 Married Caucasian Fe here for annual exam.  only new problems have been URI and GI symptoms.  She is currently on Prilosec and has lost a few pounds.  She is now off OCP since January and had a withdrawal bleed in February.  She had another menses on her own in May.  No vaginal bleeding since then.  But last month had breast tenderness without a cycle.     Patient's last menstrual period was 08/30/2013.          Sexually active: yes, limited  The current method of family planning is condom and barrier. Exercising: yes walking and elliptical 7 days a week  Smoker: no   Health Maintenance:  Pap: 11/20/11, WNL, neg HR HPV  MMG: 08/2013, normal per patient [facility] Colonoscopy: 2013, repeat in 10 years  TDaP: 11/2011 ?( or 2006) Labs:  HB:  GI, normal Urine:  Negative     reports that she has never smoked. She has never used smokeless tobacco. She reports that she drinks about one ounce of alcohol per week. She reports that she does not use illicit drugs.  Past Medical History  Diagnosis Date  . GERD (gastroesophageal reflux disease) 09/2011    Past Surgical History  Procedure Laterality Date  . Tonsillectomy and adenoidectomy  age 53  . Hysteroscopy  03/30/2008    resction of polyps and D&C  . Wisdom tooth extraction  age 53    Current Outpatient Prescriptions  Medication Sig Dispense Refill  . omeprazole (PRILOSEC) 40 MG capsule Take 1 capsule by mouth daily.      . medroxyPROGESTERone (PROVERA) 10 MG tablet Take 1 tablet (10 mg total) by mouth daily.  10 tablet  0   No current facility-administered medications for this visit.    Family History  Problem Relation Age of Onset  . Atrial fibrillation Father   . Diabetes Father   . Heart disease Brother   . Colon cancer Maternal Grandmother 60    lung cancer  . Cancer Maternal Grandfather 71    brain cancer  . Rheum arthritis Paternal  Grandmother     ROS:  Pertinent items are noted in HPI.  Otherwise, a comprehensive ROS was negative.  Exam:   BP 100/66  Pulse 60  Ht 5' 3.5" (1.613 m)  Wt 99 lb (44.906 kg)  BMI 17.26 kg/m2  LMP 08/30/2013 Height: 5' 3.5" (161.3 cm)  Ht Readings from Last 3 Encounters:  12/08/13 5' 3.5" (1.613 m)  04/08/13 5' 3.5" (1.613 m)  12/02/12 5' 3.5" (1.613 m)    General appearance: alert, cooperative and appears stated age Head: Normocephalic, without obvious abnormality, atraumatic Neck: no adenopathy, supple, symmetrical, trachea midline and thyroid normal to inspection and palpation Lungs: clear to auscultation bilaterally Breasts: normal appearance, no masses or tenderness Heart: regular rate and rhythm Abdomen: soft, non-tender; no masses,  no organomegaly Extremities: extremities normal, atraumatic, no cyanosis or edema Skin: Skin color, texture, turgor normal. No rashes or lesions Lymph nodes: Cervical, supraclavicular, and axillary nodes normal. No abnormal inguinal nodes palpated Neurologic: Grossly normal   Pelvic: External genitalia:  no lesions              Urethra:  normal appearing urethra with no masses, tenderness or lesions              Bartholin's and Skene's: normal  Vagina: normal appearing vagina with normal color and discharge, no lesions              Cervix: anteverted              Pap taken: No. Bimanual Exam:  Uterus:  normal size, contour, position, consistency, mobility, non-tender              Adnexa: no mass, fullness, tenderness               Rectovaginal: Confirms               Anus:  normal sphincter tone, no lesions  A:  Well Woman with normal exam  Perimenopausal off OCP 05/2013  Recent URI and GI problems -seeing GI now  P:   Reviewed health and wellness pertinent to exam  Pap smear not taken today  Mammogram is due 5/16  Advised her to do Provera challenge 10 mg X 10 days - she is given RX - but states she may not do right  away as her GI symptoms have been flared.  She is now on med's for a week and hopefully will be doing better.  Counseled on breast self exam, mammography screening, adequate intake of calcium and vitamin D, diet and exercise, Kegel's exercises return annually or prn  An After Visit Summary was printed and given to the patient.

## 2013-12-08 NOTE — Progress Notes (Signed)
Encounter reviewed by Dr. Brook Silva.  

## 2014-01-23 ENCOUNTER — Other Ambulatory Visit: Payer: Self-pay

## 2014-07-07 ENCOUNTER — Other Ambulatory Visit: Payer: Self-pay | Admitting: Gastroenterology

## 2014-07-07 DIAGNOSIS — R1013 Epigastric pain: Secondary | ICD-10-CM

## 2014-07-08 ENCOUNTER — Other Ambulatory Visit: Payer: Self-pay | Admitting: Gastroenterology

## 2014-07-08 DIAGNOSIS — R1084 Generalized abdominal pain: Secondary | ICD-10-CM

## 2014-07-08 DIAGNOSIS — R748 Abnormal levels of other serum enzymes: Secondary | ICD-10-CM

## 2014-07-21 ENCOUNTER — Ambulatory Visit (HOSPITAL_COMMUNITY): Payer: Self-pay

## 2014-07-24 ENCOUNTER — Ambulatory Visit (HOSPITAL_COMMUNITY)
Admission: RE | Admit: 2014-07-24 | Discharge: 2014-07-24 | Disposition: A | Discharge status: Home / Self Care | Payer: BLUE CROSS/BLUE SHIELD | Source: Ambulatory Visit | Attending: Gastroenterology | Admitting: Gastroenterology

## 2014-07-24 DIAGNOSIS — R1084 Generalized abdominal pain: Secondary | ICD-10-CM

## 2014-07-24 DIAGNOSIS — K219 Gastro-esophageal reflux disease without esophagitis: Secondary | ICD-10-CM | POA: Diagnosis not present

## 2014-07-24 DIAGNOSIS — N281 Cyst of kidney, acquired: Secondary | ICD-10-CM

## 2014-07-24 DIAGNOSIS — I3139 Other pericardial effusion (noninflammatory): Secondary | ICD-10-CM

## 2014-07-24 DIAGNOSIS — I313 Pericardial effusion (noninflammatory): Secondary | ICD-10-CM

## 2014-07-24 DIAGNOSIS — R1013 Epigastric pain: Secondary | ICD-10-CM | POA: Diagnosis present

## 2014-07-24 DIAGNOSIS — D259 Leiomyoma of uterus, unspecified: Secondary | ICD-10-CM

## 2014-07-24 DIAGNOSIS — R748 Abnormal levels of other serum enzymes: Secondary | ICD-10-CM | POA: Diagnosis not present

## 2014-07-24 HISTORY — DX: Cyst of kidney, acquired: N28.1

## 2014-07-24 HISTORY — DX: Leiomyoma of uterus, unspecified: D25.9

## 2014-07-24 HISTORY — DX: Other pericardial effusion (noninflammatory): I31.39

## 2014-07-24 HISTORY — DX: Pericardial effusion (noninflammatory): I31.3

## 2014-07-24 MED ORDER — IOHEXOL 300 MG/ML  SOLN
100.0000 mL | Freq: Once | INTRAMUSCULAR | Status: AC | PRN
  Administered 2014-07-24: 80 mL via INTRAVENOUS

## 2014-08-11 ENCOUNTER — Other Ambulatory Visit: Payer: Self-pay | Admitting: *Deleted

## 2014-08-11 DIAGNOSIS — M79605 Pain in left leg: Secondary | ICD-10-CM

## 2014-08-14 ENCOUNTER — Telehealth: Payer: Self-pay | Admitting: Nurse Practitioner

## 2014-08-14 NOTE — Telephone Encounter (Signed)
Left message to call Winston Misner at [phone]. 

## 2014-08-14 NOTE — Telephone Encounter (Signed)
Spoke with patient. Patient states that she has been having a variety of stomach related symptoms. Has been seeing a GI provider and had "multiple tests done."  Patient states that she was advised by her GI doctor to schedule an appointment with her GYN to see if hormones could be causing some of her problems. Patient has also been having problems sleeping recently. Would like to come in to discuss hormone testing. Appointment scheduled for 5/10 at 11:15am with Milford Cage, [place]. Patient is agreeable to date and time.  Routing to provider for final review. Patient agreeable to disposition. Patient aware provider will review message and nurse will return call with any additional instructions or change of disposition. Will close encounter.

## 2014-08-14 NOTE — Telephone Encounter (Signed)
Pt having menopausal issues.

## 2014-08-17 ENCOUNTER — Telehealth: Payer: Self-pay | Admitting: Nurse Practitioner

## 2014-08-17 NOTE — Telephone Encounter (Signed)
Left message regarding upcoming appointment has been canceled and needs to be rescheduled. °

## 2014-08-18 ENCOUNTER — Telehealth: Payer: Self-pay | Admitting: Nurse Practitioner

## 2014-08-18 ENCOUNTER — Ambulatory Visit: Payer: Self-pay | Admitting: Nurse Practitioner

## 2014-08-18 NOTE — Telephone Encounter (Signed)
Spoke with patient. Advised order for diagnostic imaging with ultrasound has been faxed with cover sheet and confirmation to [place]. Patient is agreeable. Patient would also like to reschedule her appointment to discuss hormone levels with Milford Cage, FNP that was scheduled for today as Milford Cage, FNP due to provider being sick. Appointment rescheduled to 5/17 at 2:15pm with Milford Cage, [place]. Patient is agreeable to date and time.  Routing to provider for final review. Patient agreeable to disposition. Patient aware provider will review message and nurse will return call with any additional instructions or change of disposition. Will close encounter.

## 2014-08-18 NOTE — Telephone Encounter (Signed)
Pt checking on order to have repeat mammogram done tomorrow.

## 2014-08-24 NOTE — Telephone Encounter (Signed)
Patient canceled her upcoming appointment "wants hormone levels checked". Patient did not wish to reschedule.

## 2014-08-25 ENCOUNTER — Ambulatory Visit: Payer: Self-pay | Admitting: Nurse Practitioner

## 2014-08-27 ENCOUNTER — Encounter: Payer: Self-pay | Admitting: Vascular Surgery

## 2014-08-28 ENCOUNTER — Ambulatory Visit (INDEPENDENT_AMBULATORY_CARE_PROVIDER_SITE_OTHER): Payer: BLUE CROSS/BLUE SHIELD | Admitting: Vascular Surgery

## 2014-08-28 ENCOUNTER — Ambulatory Visit (HOSPITAL_COMMUNITY)
Admission: RE | Admit: 2014-08-28 | Discharge: 2014-08-28 | Disposition: A | Discharge status: Home / Self Care | Payer: BLUE CROSS/BLUE SHIELD | Source: Ambulatory Visit | Attending: Vascular Surgery | Admitting: Vascular Surgery

## 2014-08-28 ENCOUNTER — Encounter: Payer: Self-pay | Admitting: Vascular Surgery

## 2014-08-28 VITALS — BP 110/66 | HR 48 | Resp 14 | Ht 63.5 in | Wt 100.0 lb

## 2014-08-28 DIAGNOSIS — M25562 Pain in left knee: Secondary | ICD-10-CM

## 2014-08-28 DIAGNOSIS — M79605 Pain in left leg: Secondary | ICD-10-CM | POA: Insufficient documentation

## 2014-08-28 NOTE — Progress Notes (Signed)
[facility]  Patient name: Laurie Griffith MRN: [id number] DOB: 22-Apr-1960 Sex: female  REASON FOR CONSULT: Bilateral leg pain. Left worse than right. Referred by Dr. Delilah Shan  HPI: Laurie Griffith is a 54 y.o. female who has had pain in her left calf for 20 years. Over the last 5 years the pain has become more significant. She describes pain in a fairly focal area in the posterior lateral aspect of her left calf. The pain is aggravated by walking and running.  She states that it is not necessarily relieved with elevation. She is unaware of any previous history of DVT or phlebitis. As the pain is fairly focal I do not think that this represents claudication. She denies any history of rest pain or nonhealing ulcers.  She does have mildly elevated cholesterol but no other significant risk factors for peripheral vascular disease. She denies any history of diabetes, hypertension, history of premature cardiovascular disease in her family, or history of tobacco use.  I have reviewed the records from [facility] orthopedics. She has had left lower leg pain. She has had an MRI of the lower leg which shows some prominent veins where she's having calf pain.  Past Medical History  Diagnosis Date  . GERD (gastroesophageal reflux disease) 09/2011   Family History  Problem Relation Age of Onset  . Atrial fibrillation Father   . Diabetes Father   . Heart disease Brother   . Colon cancer Maternal Grandmother 60    lung cancer  . Cancer Maternal Grandfather 74    brain cancer  . Rheum arthritis Paternal Grandmother    SOCIAL HISTORY: History  Substance Use Topics  . Smoking status: Never Smoker   . Smokeless tobacco: Never Used  . Alcohol Use: 1.2 oz/week    2 Standard drinks or equivalent per week   Allergies  Allergen Reactions  . Penicillins Rash  . Sulfa Antibiotics Rash   Current Outpatient Prescriptions  Medication Sig Dispense Refill  .  medroxyPROGESTERone (PROVERA) 10 MG tablet Take 1 tablet (10 mg total) by mouth daily. (Patient not taking: Reported on 08/28/2014) 10 tablet 0  . omeprazole (PRILOSEC) 40 MG capsule Take 1 capsule by mouth daily.     No current facility-administered medications for this visit.   REVIEW OF SYSTEMS: Valu.Nieves ] denotes positive finding; [  ] denotes negative finding  CARDIOVASCULAR:  [ ]  chest pain   [ ]  chest pressure   Valu.Nieves ] palpitations   [ ]  orthopnea   [ ]  dyspnea on exertion   [ ]  claudication   [ ]  rest pain   [ ]  DVT   [ ]  phlebitis PULMONARY:   [ ]  productive cough   [ ]  asthma   [ ]  wheezing NEUROLOGIC:   [ ]  weakness  [ ]  paresthesias  [ ]  aphasia  [ ]  amaurosis  [ ]  dizziness HEMATOLOGIC:   [ ]  bleeding problems   [ ]  clotting disorders MUSCULOSKELETAL:  [ ]  joint pain   [ ]  joint swelling [ ]  leg swelling GASTROINTESTINAL: [ ]   blood in stool  [ ]   hematemesis GENITOURINARY:  [ ]   dysuria  [ ]   hematuria PSYCHIATRIC:  [ ]  history of major depression INTEGUMENTARY:  [ ]  rashes  [ ]  ulcers CONSTITUTIONAL:  [ ]  fever   [ ]  chills  PHYSICAL EXAM: Filed Vitals:   08/28/14 1010  BP: 110/66  Pulse: 48  Resp: 14  Height: 5' 3.5" (  1.613 m)  Weight: 100 lb (45.36 kg)   GENERAL: The patient is a well-nourished female, in no acute distress. The vital signs are documented above. CARDIOVASCULAR: There is a regular rate and rhythm. I do not detect carotid bruits. She has palpable femoral, popliteal, and dorsalis pedis pulses bilaterally. There is no significant lower extremity swelling. PULMONARY: There is good air exchange bilaterally without wheezing or rales. ABDOMEN: Soft and non-tender with normal pitched bowel sounds.  MUSCULOSKELETAL: There are no major deformities or cyanosis. NEUROLOGIC: No focal weakness or paresthesias are detected. SKIN: I do not see any significant varicose veins. She has since all spider veins in the posterior aspect of both legs.PSYCHIATRIC: The patient has a  normal affect.  DATA:  I have independently interpreted her arterial Doppler study today which shows triphasic Doppler signals in the dorsalis pedis and posterior tibial positions bilaterally. Digital waveforms are normal. ABIs are greater than 100% bilaterally.  I reviewed her MRI today although I do not have the radiologist's report. They noted some prominent veins in the posterior left calf and I do not really appreciate these.  MEDICAL ISSUES:  LEFT CALF PAIN: I reassured her that she has no evidence of significant arterial insufficiency. She has a normal arterial Doppler study. MRI does not show any evidence of popliteal artery entrapment or other arterial abnormality. I also reviewed his CT scan from recently which shows no evidence of aortoiliac occlusive disease. Although there is no real history of venous disease, certainly given the MRI findings as interpreted by the radiologist the pain could be related to venous hypertension. I recommended that she try a compression sleeve when walking or running and I have also written her a prescription for knee-high compression stockings with a gradient of 15-20 mmHg. We have also discussed the importance of leg elevation in the proper positioning for this. If these measures improve her symptoms then certainly this could be related to venous insufficiency. If her symptoms progress she could be considered for a formal reflux study of her left lower extremity. However we both agreed that this was not necessary at this time. If her symptoms do not improve with a compression sleeve or compression stocking, then I think the pain is more likely musculoskeletal in origin and she will continue to follow up with Dr. Delilah Shan.   Deitra Mayo [facility] [place]: [phone]

## 2014-09-03 ENCOUNTER — Encounter: Payer: Self-pay | Admitting: Sports Medicine

## 2014-12-16 ENCOUNTER — Encounter: Payer: Self-pay | Admitting: Nurse Practitioner

## 2014-12-16 ENCOUNTER — Ambulatory Visit (INDEPENDENT_AMBULATORY_CARE_PROVIDER_SITE_OTHER): Payer: BLUE CROSS/BLUE SHIELD | Admitting: Nurse Practitioner

## 2014-12-16 VITALS — BP 90/56 | HR 76 | Ht 63.75 in | Wt 99.0 lb

## 2014-12-16 DIAGNOSIS — Z0000 Encounter for general adult medical examination without abnormal findings: Secondary | ICD-10-CM

## 2014-12-16 DIAGNOSIS — Z01419 Encounter for gynecological examination (general) (routine) without abnormal findings: Secondary | ICD-10-CM

## 2014-12-16 LAB — POCT URINALYSIS DIPSTICK
Bilirubin, UA: NEGATIVE
GLUCOSE UA: NEGATIVE
Ketones, UA: NEGATIVE
Leukocytes, UA: NEGATIVE
NITRITE UA: NEGATIVE
PH UA: 7
PROTEIN UA: NEGATIVE
RBC UA: NEGATIVE
UROBILINOGEN UA: NEGATIVE

## 2014-12-16 NOTE — Progress Notes (Signed)
Patient ID: Laurie Griffith, female   DOB: 1961-02-15, 53 y.o.   MRN: [id number] 54 y.o. G0P0000 Married  Caucasian Fe here for annual exam.  Menses this last year has been  erratic at 2-3 months, then several in a row and now no menses since May.  Still some GI symptoms.  Taking Metamucil daily.  CT scan was normal.  Taking probiotic Ultra Flor IB daily.  Not SA.   Patient's last menstrual period was 08/21/2014 (exact date).          Sexually active:  No The current method of family planning is abstinence.    Exercising: Yes.    Home exercise routine includes walking and elliptical. Smoker:  no  Health Maintenance: Pap: 11/20/11, WNL, neg HR HPV  MMG: 08/14/14 with Diagnostic Left on 08/19/14, Density Category C, Bi-Rads 2:  Benign Colonoscopy: 2013, repeat in 10 years  TDaP: 11/2011 ?( or 2006) Labs: HB: PCP or GIUrine: Negative    reports that she has never smoked. She has never used smokeless tobacco. She reports that she drinks about 1.2 oz of alcohol per week. She reports that she does not use illicit drugs.  Past Medical History  Diagnosis Date  . GERD (gastroesophageal reflux disease) 09/2011    Past Surgical History  Procedure Laterality Date  . Tonsillectomy and adenoidectomy  age 73  . Hysteroscopy  03/30/2008    resction of polyps and D&C  . Wisdom tooth extraction  age 72    No current outpatient prescriptions on file.   No current facility-administered medications for this visit.    Family History  Problem Relation Age of Onset  . Atrial fibrillation Father   . Diabetes Father   . Heart disease Brother   . Colon cancer Maternal Grandmother 60    lung cancer  . Cancer Maternal Grandfather 52    brain cancer  . Rheum arthritis Paternal Grandmother     ROS:  Pertinent items are noted in HPI.  Otherwise, a comprehensive ROS was negative.  Exam:   BP 90/56 mmHg  Pulse 76  Ht 5' 3.75" (1.619 m)  Wt 99 lb (44.906 kg)  BMI 17.13 kg/m2  LMP  08/21/2014 (Exact Date) Height: 5' 3.75" (161.9 cm) Ht Readings from Last 3 Encounters:  12/16/14 5' 3.75" (1.619 m)  08/28/14 5' 3.5" (1.613 m)  12/08/13 5' 3.5" (1.613 m)    General appearance: alert, cooperative and appears stated age Head: Normocephalic, without obvious abnormality, atraumatic Neck: no adenopathy, supple, symmetrical, trachea midline and thyroid normal to inspection and palpation Lungs: clear to auscultation bilaterally Breasts: normal appearance, no masses or tenderness Heart: regular rate and rhythm Abdomen: soft, non-tender; no masses,  no organomegaly Extremities: extremities normal, atraumatic, no cyanosis or edema Skin: Skin color, texture, turgor normal. No rashes or lesions Lymph nodes: Cervical, supraclavicular, and axillary nodes normal. No abnormal inguinal nodes palpated Neurologic: Grossly normal   Pelvic: External genitalia:  no lesions              Urethra:  normal appearing urethra with no masses, tenderness or lesions              Bartholin's and Skene's: normal                 Vagina: normal appearing vagina with normal color and discharge, no lesions              Cervix: anteverted  Pap taken: Yes.   Bimanual Exam:  Uterus:  normal size, contour, position, consistency, mobility, non-tender              Adnexa: no mass, fullness, tenderness               Rectovaginal: Confirms               Anus:  normal sphincter tone, no lesions  Chaperone present: yes  A:  Well Woman with normal exam  Perimenopausal off OCP 05/2013  Irregular menses this past year - no menses since May Recent URI and GI problems -seeing GI now   P:   Reviewed health and wellness pertinent to exam  Pap smear as above  Mammogram is due 08/2015  Declines Provera challenge - will keep menses calendar  Declines additional labs as this was done at PCP and GI  Counseled on breast self exam, mammography screening, adequate intake of calcium and  vitamin D, diet and exercise return annually or prn  An After Visit Summary was printed and given to the patient.

## 2014-12-16 NOTE — Patient Instructions (Addendum)

## 2014-12-18 LAB — IPS PAP TEST WITH HPV

## 2014-12-18 NOTE — Progress Notes (Signed)
Encounter reviewed by Dr. Brook Amundson C. Silva.  

## 2014-12-21 ENCOUNTER — Ambulatory Visit: Payer: BC Managed Care – PPO | Admitting: Nurse Practitioner

## 2014-12-24 ENCOUNTER — Telehealth: Payer: Self-pay

## 2014-12-24 ENCOUNTER — Encounter: Payer: Self-pay | Admitting: Nurse Practitioner

## 2014-12-24 NOTE — Telephone Encounter (Signed)
Spoke with patient regarding [employer]. Please see telephone encounter dated with today's date.  Routing to provider for final review. Patient agreeable to disposition. Will close encounter.

## 2014-12-24 NOTE — Telephone Encounter (Signed)
Spoke with patient advised of pap results as seen below. Advised patient that per review of paper chart last tetanus was performed in 2006. Patient is agreeable. Patient is aware she is due for tetanus shot this year. Patient would like to be seen with her PCP or another facility to have her tetanus and flu shot at the same time. Will return call if she changes her mind and would like to schedule nurse visit for tetanus shot with our office.  Notes Recorded by Graylon Good, CMA on 12/22/2014 at 4:20 PM AEX 12/28/15 Notes Recorded by Kem Boroughs, FNP on 12/18/2014 at 4:18 PM pap02  Routing to provider for final review. Patient agreeable to disposition. Will close encounter.

## 2014-12-24 NOTE — Telephone Encounter (Signed)
Non-Urgent Medical Question  Message [phone]   From  [place], FNP   Sent  12/24/2014 8:38 AM     I wanted to check that my pap test is ok. The report is unclear to me. Also, Chong Sicilian was checking on my records for my last Tetanus shot. It may have been in 2013. She was to get back to me on what my paper file indicated. I have not heard back yet. Thank you.   Laurie Griffith      Responsible Party    Pool - [facility] Pool No one has taken responsibility for this message.     No actions have been taken on this message.

## 2014-12-31 ENCOUNTER — Other Ambulatory Visit: Payer: Self-pay | Admitting: Gastroenterology

## 2014-12-31 DIAGNOSIS — R1013 Epigastric pain: Secondary | ICD-10-CM

## 2015-01-28 ENCOUNTER — Other Ambulatory Visit (HOSPITAL_COMMUNITY): Payer: BLUE CROSS/BLUE SHIELD

## 2015-01-28 ENCOUNTER — Ambulatory Visit (HOSPITAL_COMMUNITY): Payer: BLUE CROSS/BLUE SHIELD

## 2015-02-12 ENCOUNTER — Encounter (HOSPITAL_COMMUNITY)
Admission: RE | Admit: 2015-02-12 | Discharge: 2015-02-12 | Disposition: A | Discharge status: Home / Self Care | Payer: BLUE CROSS/BLUE SHIELD | Source: Ambulatory Visit | Attending: Gastroenterology | Admitting: Gastroenterology

## 2015-02-12 ENCOUNTER — Ambulatory Visit (HOSPITAL_COMMUNITY)
Admission: RE | Admit: 2015-02-12 | Discharge: 2015-02-12 | Disposition: A | Discharge status: Home / Self Care | Payer: BLUE CROSS/BLUE SHIELD | Source: Ambulatory Visit | Attending: Gastroenterology | Admitting: Gastroenterology

## 2015-02-12 DIAGNOSIS — N281 Cyst of kidney, acquired: Secondary | ICD-10-CM | POA: Insufficient documentation

## 2015-02-12 DIAGNOSIS — R1013 Epigastric pain: Secondary | ICD-10-CM

## 2015-02-12 DIAGNOSIS — R1010 Upper abdominal pain, unspecified: Secondary | ICD-10-CM | POA: Diagnosis present

## 2015-02-12 MED ORDER — SINCALIDE 5 MCG IJ SOLR: 0.0200 ug/kg | Freq: Once | INTRAMUSCULAR | Status: DC

## 2015-02-12 MED ORDER — TECHNETIUM TC 99M MEBROFENIN IV KIT
5.1000 | PACK | Freq: Once | INTRAVENOUS | Status: DC | PRN
  Administered 2015-02-12: 5 via INTRAVENOUS
  Filled 2015-02-12: qty 6

## 2015-02-23 ENCOUNTER — Telehealth: Payer: Self-pay | Admitting: Nurse Practitioner

## 2015-02-23 ENCOUNTER — Other Ambulatory Visit: Payer: Self-pay | Admitting: Gastroenterology

## 2015-02-23 DIAGNOSIS — R6881 Early satiety: Secondary | ICD-10-CM

## 2015-02-23 DIAGNOSIS — R1013 Epigastric pain: Secondary | ICD-10-CM

## 2015-02-23 NOTE — Telephone Encounter (Signed)
Patient says she is having hormonal issues and requesting to see Dr Sabra Heck.

## 2015-02-23 NOTE — Telephone Encounter (Signed)
Left message to call Harald Quevedo at [phone]. 

## 2015-02-24 NOTE — Telephone Encounter (Signed)
Left detailed message at number provided [phone], okay per ROI. Advised of appointment opening on 11/22 at 2:45 pm with Dr.Miller and 11/23 at 9:45 am with Dr.Miller. Advised to return call to discuss scheduling.

## 2015-02-24 NOTE — Telephone Encounter (Signed)
Routing to [place] as Juluis Rainier. Will close encounter.

## 2015-02-24 NOTE — Telephone Encounter (Signed)
Patient called back she is scheduled for 03/03/15 nat 9:45 with Dr. Sabra Heck.

## 2015-02-24 NOTE — Telephone Encounter (Signed)
Patient left message on our voicemail she said she is returning nurse's call about dates to come in to see Dr. Sabra Heck. She said she will be in meetings all day and that it's okay to leave detailed message on vm in regards to dates or send a MyChart message. Best # to reach: [phone]

## 2015-03-02 ENCOUNTER — Ambulatory Visit (HOSPITAL_COMMUNITY)
Admission: RE | Admit: 2015-03-02 | Discharge: 2015-03-02 | Disposition: A | Discharge status: Home / Self Care | Payer: BLUE CROSS/BLUE SHIELD | Source: Ambulatory Visit | Attending: Gastroenterology | Admitting: Gastroenterology

## 2015-03-02 DIAGNOSIS — R6881 Early satiety: Secondary | ICD-10-CM

## 2015-03-02 DIAGNOSIS — R1013 Epigastric pain: Secondary | ICD-10-CM

## 2015-03-02 MED ORDER — TECHNETIUM TC 99M SULFUR COLLOID
2.0000 | Freq: Once | INTRAVENOUS | Status: DC | PRN
  Administered 2015-03-02: 2 via ORAL
  Filled 2015-03-02: qty 2

## 2015-03-03 ENCOUNTER — Ambulatory Visit (INDEPENDENT_AMBULATORY_CARE_PROVIDER_SITE_OTHER): Payer: BLUE CROSS/BLUE SHIELD | Admitting: Obstetrics & Gynecology

## 2015-03-03 ENCOUNTER — Encounter: Payer: Self-pay | Admitting: Obstetrics & Gynecology

## 2015-03-03 VITALS — BP 100/70 | HR 60 | Resp 16 | Ht 63.75 in | Wt 103.0 lb

## 2015-03-03 DIAGNOSIS — K219 Gastro-esophageal reflux disease without esophagitis: Secondary | ICD-10-CM

## 2015-03-03 DIAGNOSIS — Z79890 Hormone replacement therapy (postmenopausal): Secondary | ICD-10-CM | POA: Diagnosis not present

## 2015-03-03 MED ORDER — ESTRADIOL-NORETHINDRONE ACET 0.05-0.14 MG/DAY TD PTTW: 1.0000 | MEDICATED_PATCH | TRANSDERMAL | Status: DC

## 2015-03-03 NOTE — Progress Notes (Signed)
Subjective:     Patient ID: Laurie Griffith, female   DOB: 1961-03-16, 54 y.o.   MRN: [id number]  HPI 54 yo G0 MWF here for complaint of increased sensation of GI motility, that she describes as "churning", that she feels is related to menopause.  Pt is also having increased reflux with the above symptoms.  Pt reports early satiety as well and associated weight loss (that has stabilized).  Pt has undergone GI evaluation with two different gastroenterologists.  She had an upper endoscopy as well as colonoscopy which did not show anything significant.  She's also had a CT of the abdomen and pelvis as well as an abdominal ultrasound.  We reviewed both of these today.  She just had a gastric emptying test but doesn't know the results of this yet.     Pt aware all of this is GI but she feels there may be some relation to hormones.  Reports with her two cycles this year--one in May and then two weeks ago--she noted almost all of the symptoms resolved.  She thought it was a fluke with the first bleeding episode in May but now wonders if they are related.  The improvement in her GI symptoms really was a surprise.   She went off her OCP in 12/14.  She was having "extremely itchy feet" and she felt this was related to being on the OCPs.  The itching resolved but then she feels like the GI issues began.    Pt wants to talk about HRT usage and safety of stopping if this doesn't help.  Different routes for administration discussed.  Risks and benefits reviewed.  Specific risks discussed including DVT/PE/stroke/MI.  Breast cancer risks and possible bleeding reviewed.  Pt would like to start.  Review of Systems  All other systems reviewed and are negative.      Objective:   Physical Exam  Constitutional: She is oriented to person, place, and time. She appears well-developed and well-nourished.  Neurological: She is alert and oriented to person, place, and time.  Skin: Skin is warm and dry.  Psychiatric: She  has a normal mood and affect.   No other physical exam performed.    Assessment:     GI symptoms that pt feels are possibly postmenopausal in relation Desirous of starting HRT Normal MMG 5/16 with grade C breast density Pap neg with neg HR HPV 9/16    Plan:     Will start combipatch 0.05/0.140 mg patches to skin twice weekly. Recheck 6 weeks   ~20 minutes spent with patient >50% of time was in face to face discussion of above.

## 2015-03-08 ENCOUNTER — Institutional Professional Consult (permissible substitution): Payer: BLUE CROSS/BLUE SHIELD | Admitting: Obstetrics & Gynecology

## 2015-03-08 DIAGNOSIS — K219 Gastro-esophageal reflux disease without esophagitis: Secondary | ICD-10-CM | POA: Insufficient documentation

## 2015-03-08 DIAGNOSIS — Z79890 Hormone replacement therapy (postmenopausal): Secondary | ICD-10-CM | POA: Insufficient documentation

## 2015-07-28 ENCOUNTER — Encounter: Payer: Self-pay | Admitting: Nurse Practitioner

## 2015-07-28 ENCOUNTER — Ambulatory Visit (INDEPENDENT_AMBULATORY_CARE_PROVIDER_SITE_OTHER): Payer: BLUE CROSS/BLUE SHIELD | Admitting: Nurse Practitioner

## 2015-07-28 VITALS — BP 108/66 | HR 60 | Ht 63.75 in | Wt 101.0 lb

## 2015-07-28 DIAGNOSIS — N9089 Other specified noninflammatory disorders of vulva and perineum: Secondary | ICD-10-CM

## 2015-07-28 MED ORDER — BETAMETHASONE VALERATE 0.1 % EX OINT: 1.0000 "application " | TOPICAL_OINTMENT | Freq: Two times a day (BID) | CUTANEOUS | Status: DC

## 2015-07-28 MED ORDER — ESTRADIOL-NORETHINDRONE ACET 0.05-0.14 MG/DAY TD PTTW: 1.0000 | MEDICATED_PATCH | TRANSDERMAL | Status: DC

## 2015-07-28 NOTE — Progress Notes (Signed)
55 y.o. Married Caucasian female G0P0000 here with complaint of clitoral changes for over a month.  States she noted symptoms with her cycle in February with dryness and crusty areas at the hair line.  She then continued to have itching on and off with soreness above the clitoris and a very firm tender area as well.  Symptoms increase again 2 days ago.  No real vaginal symptoms of itching, burning, or discharge.  Denies new personal products or vaginal dryness. No STD concerns, married, but not SA in some time. Urinary symptoms none . Contraception is abstinence.   LMP 07/06/15; PMP 06/01/15.  She did have amenorrhea for 3 mos prior.  Night sweats are still present but tolerable. She was started on Combipatch to see if GI symptoms would improve after consult with Dr. Sabra Heck.  She went to pharmacy and med's were on back order in the fall.  They were to call when in and she has not gotten a call - would like to try and get RX.  GI symptoms go in waves now intermittent.  There has been no trauma or riding bike or horseback.   O:  Healthy female WDWN Affect: normal, orientation x 3  Exam: no distress Abdomen: soft and non tender Lymph node:  Enlargement above the clitoral hood with slight tenderness Pelvic exam: External genital: normal female BUS: enlarged clitoris without exudate but appears to have a cyst.  Slight scaliness along the left side next to the hood Vagina: no discharge noted.   Patient was also seen by Dr. Talbert Nan:   A: Clitoral enlargement - ? Etiology  ? Cyst with lymphadenopathy   P: Discussed findings of clitoral enlargement and possible cyst   Rx: refill on Combipatch as directed  RX: Valisone 0.1 % to area of irritation very small amount 1-2 times a day only prn  She is given a note for a total Testosterone level per Dr. Talbert Nan as she will get this lab done at PCP and will follow.  She is instructed to follow up in a month or call earlier if increases in size or pain   RV  prn

## 2015-07-30 ENCOUNTER — Encounter: Payer: Self-pay | Admitting: Obstetrics & Gynecology

## 2015-07-30 ENCOUNTER — Ambulatory Visit (INDEPENDENT_AMBULATORY_CARE_PROVIDER_SITE_OTHER): Payer: BLUE CROSS/BLUE SHIELD | Admitting: Obstetrics & Gynecology

## 2015-07-30 ENCOUNTER — Telehealth: Payer: Self-pay | Admitting: *Deleted

## 2015-07-30 VITALS — BP 100/62 | HR 60 | Temp 98.1°F | Resp 16 | Ht 63.75 in | Wt 100.0 lb

## 2015-07-30 DIAGNOSIS — N9089 Other specified noninflammatory disorders of vulva and perineum: Secondary | ICD-10-CM

## 2015-07-30 MED ORDER — DOXYCYCLINE HYCLATE 100 MG PO CAPS: 100.0000 mg | ORAL_CAPSULE | Freq: Two times a day (BID) | ORAL | Status: DC

## 2015-07-30 MED ORDER — METHYLPREDNISOLONE 4 MG PO TABS: ORAL_TABLET | ORAL | Status: DC

## 2015-07-30 NOTE — Telephone Encounter (Signed)
Left message to call Kateria Cutrona at [phone]. 

## 2015-07-30 NOTE — Progress Notes (Signed)
Subjective:     Patient ID: Laurie Griffith, female   DOB: 07-20-1960, 55 y.o.   MRN: [id number]  HPI 55 yo G0 MWF here for complaint of increased pain and swelling with clitoral enlargement.  Pt was seen on 07/28/15 by Kem Boroughs.  Testosterone level was recommended.  Pt had this done this morning at Dr. Sammuel Cooper office.  Betamethasone ointment was started.  Pt feels the tenderness and enlargement has worsened since this was started.  She's been feeling mildly increase in clitoral size over the past month but the last week seems to be worse.  She felt this all started after her menstrual cycle about a month ago.  She felt there was dried blood and she "maybe agressively cleaned" the area.  Then is began to feel a little swollen and tender.  Gradually, this has increased and worsened until the point that she, now, is aware of the enlargement all of the time.  Denies trauma.  Denies increased hair growth on body or hair loss on head.  No issues with headaches or other body changes.  She continues to have her GI issues.  She has contemplated starting HRT as we discussed in November because she's really felt the GI issues started when she started missing her cycles.  However, she did cycle in February and march.  Pt had an [place] draw today with Dr. Carlota Raspberry as well.  Denies urinary symptom changes.  Review of Systems  All other systems reviewed and are negative.      Objective:   Physical Exam  Constitutional: She is oriented to person, place, and time. She appears well-developed and well-nourished.  Abdominal: Soft. Bowel sounds are normal. She exhibits no distension and no mass. There is no tenderness. There is no rebound and no guarding.  Genitourinary: Vagina normal and uterus normal.    There is no rash, tenderness or lesion on the right labia. There is no rash, tenderness or lesion on the left labia. Cervix exhibits no motion tenderness. Right adnexum displays no mass, no tenderness and  no fullness. Left adnexum displays no mass, no tenderness and no fullness.  Lymphadenopathy:       Right: No inguinal adenopathy present.       Left: Inguinal (one mid inguinal nod palpable, about 1 cm, mobile) adenopathy present.  Neurological: She is alert and oriented to person, place, and time.  Skin: Skin is warm and dry.  No change in hair pattern.  Psychiatric: She has a normal mood and affect.   Dr. Quincy Simmonds examined pt with me as this is something I have never seen.  D/W pt evaluation.  Will need testosterone level and probable imaging.  Spoke with radiology as well for best imaging for clitoris.    Bedside ultrasound performed of clitoris.  No obvious cystic lesion noted.    Assessment:     Clitoral enlargement with surrounding erythema, edema     Plan:     Doxycycline 100mg  bid x 7 days in case infectious cause is present Medrol taper prescribed to help with swelling Cold to area for 48minutes on and 20 minutes off for comfort Stop steroid ointment Release for copies of labs obtained.  Will try to get this early next week.  If testosterone is normal, pt will need MRI of pelvis.  If elevated, will need MRI of adrenal glands and pelvis or MRI of adrenal glands and PUS of ovaries. Advised not to start HRT at this time until this current  issue is resolved.  Pt in agreement.  Pt clearly aware of plan and is aware I will be out of the office next week.  Delana Meyer has release of records to send on Monday.  Dr. Quincy Simmonds will be following.  Lamont Snowball, RN, is aware of possible need for MRI.  ~30 minutes spent with patient >50% of time was in face to face discussion of above.

## 2015-07-30 NOTE — Telephone Encounter (Signed)
Spoke with patient. Patient was seen in the office on 07/28/2015 by Kem Boroughs, FNP for clitoral enlargement and pain. Patient states that she has been using the Betamethasone ointment as directed without relief. Reports that the area has continued to swell and that her pain persists. Denies worsening pain, fever, or chills. Advised she will need to be seen in the office for further evaluation today. She is agreeable. Appointment scheduled for today 07/30/2015 at 3:30 pm with Dr.Miller. She is agreeable to date and time.  Routing to provider for final review. Patient agreeable to disposition. Will close encounter.

## 2015-07-30 NOTE — Telephone Encounter (Signed)
Patient was seen for OV 07/28/15 and was prescribed betamethasone valerate ointment (VALISONE) 0.1 % she believes her problem is getting worse. Best # to reach: [phone] (okay to lm since she will be in a meeting from 9-10 am) Preferred Pharmacy: [facility] on [address]

## 2015-08-02 ENCOUNTER — Telehealth: Payer: Self-pay

## 2015-08-02 NOTE — Telephone Encounter (Signed)
Spoke with patient at time of incoming call. Patient was seen on 07/30/2015 with Dr.Miller for evaluation of clitoral enlargement. Patient is calling to check on the status of her lab results being sent to our office for Dr.Silvas review as Dr.Miller is out of the office this week. States she spoke with Dr.Greene's office and the results are on Dr.Greene's desk and they are waiting for him to review these results prior to sending these over. Advised as soon as we receive these results we will be in contact with her regarding further recommendations. Patient is extremely concerned and would like a call with further recommendations as soon as these are received. States she is feeling much better since she started taking Doxycycline. Patient would like to be seen with Dr.Silva prior to scheduling an MRI. "I just want to make sure I have a recheck before I move forward with a lot of testing." Follow up appointment scheduled for 08/04/2015 at 8:30 am with Dr.Silva. She is agreeable to date and time.  Assessment:     Clitoral enlargement with surrounding erythema, edema     Plan:     Doxycycline 100mg  bid x 7 days in case infectious cause is present Medrol taper prescribed to help with swelling Cold to area for 76minutes on and 20 minutes off for comfort Stop steroid ointment Release for copies of labs obtained. Will try to get this early next week. If testosterone is normal, pt will need MRI of pelvis. If elevated, will need MRI of adrenal glands and pelvis or MRI of adrenal glands and PUS of ovaries. Advised not to start HRT at this time until this current issue is resolved. Pt in agreement.  Pt clearly aware of plan and is aware I will be out of the office next week. Delana Meyer has release of records to send on Monday. Dr. Quincy Simmonds will be following. Lamont Snowball, RN, is aware of possible need for MRI.  ~30 minutes spent with patient >50% of time was in face to face discussion of above.         Cc: Olga Millers for follow up of lab results

## 2015-08-02 NOTE — Telephone Encounter (Signed)
Lab results faxed over from Dr. Rolly Salter office and laid on Dr. Elza Rafter desk for review, patient is aware.

## 2015-08-02 NOTE — Telephone Encounter (Signed)
I have reviewed the lab results, and the testosterone does not appear to have been processed.  I recommend rechecking this level when she returns to the office for a recheck with me on 08/04/15.

## 2015-08-02 NOTE — Telephone Encounter (Signed)
I am happy to see the patient on 08/04/15. Please place a copy of her labs from Dr. Rolly Salter office on my desk when they arrive.

## 2015-08-03 NOTE — Telephone Encounter (Signed)
Patient is calling for recent result.

## 2015-08-03 NOTE — Telephone Encounter (Signed)
I have reviewed this encounter and closed it.

## 2015-08-03 NOTE — Telephone Encounter (Signed)
Spoke with patient. Advised of message as seen below from [place]. "It was not run because the lab they use feels that if you do not do a testosterone in the morning the levels will drop. Your office was going to do this in the afternoon the other day." Patient is asking if we may speak to the lab that Dr.Greene's office uses to see if the testosterone can be processed. Advised we are unable to speak with another facilities lab regarding the orders placed by another physician. Advised Dr.Greene would be able to speak with the lab who performs the processing. Advised we can perform this lab when she is seen tomorrow in the office with Dr.Silva. This way Dr.Silva can order any other labs that may be needed as well. She is agreeable and verbalizes understanding. She will keep her appointment as scheduled for tomorrow at 8:30 am with Dr.Silva.   Routing to Dr.Silva for review prior to closing.

## 2015-08-04 ENCOUNTER — Other Ambulatory Visit: Payer: Self-pay | Admitting: Obstetrics and Gynecology

## 2015-08-04 ENCOUNTER — Encounter: Payer: Self-pay | Admitting: Obstetrics and Gynecology

## 2015-08-04 ENCOUNTER — Ambulatory Visit (INDEPENDENT_AMBULATORY_CARE_PROVIDER_SITE_OTHER): Payer: BLUE CROSS/BLUE SHIELD | Admitting: Obstetrics and Gynecology

## 2015-08-04 VITALS — BP 110/64 | HR 60 | Ht 63.75 in | Wt 98.4 lb

## 2015-08-04 DIAGNOSIS — N9089 Other specified noninflammatory disorders of vulva and perineum: Secondary | ICD-10-CM

## 2015-08-04 NOTE — Progress Notes (Signed)
Patient ID: Laurie Griffith, female   DOB: April 13, 1960, 55 y.o.   MRN: [id number] GYNECOLOGY  VISIT   HPI: 55 y.o.   Married  Caucasian  female   G0P0000 with Patient's last menstrual period was 07/06/2015 (exact date).   here for follow up visit.    Feels like her anatomy is almost back to normal.  Worsened after her last visit.  Iced and took Doxycycline after her last visit.  Had a bleeding episode from the clitoral area and then the swelling was much less the next day.  Then started the steroid dose pack.  Concerned about vulvar cancer and vulvodynia.  Still with left sided pain of the clitorus.  Feels like a paper cut.   Having menses irregularly.   Menses Feb. And March.  Can skip 5 months at a time.  Not on HRT.  Patient states she developed knee pain the same time as her February period.  Also noting left pelvic pain that has been sharp and acute, occuring twice and not lasting.   Testosterone level not run at her PCP office.  GYNECOLOGIC HISTORY: Patient's last menstrual period was 07/06/2015 (exact date). Contraception:  Abstinence Menopausal hormone therapy:  none Last mammogram: 08-14-15 3D/Density D/12mm nodule Lt.Br--further views needed;  08-19-14 Diag.Left/Density C/Neg/BiRads2:[facility]--return to screening 55yr Last pap smear:   12-16-14 Neg:Neg HR HPV        OB History    Gravida Para Term Preterm AB TAB SAB Ectopic Multiple Living   0 0 0 0 0 0 0 0 0 0          Patient Active Problem List   Diagnosis Date Noted  . Postmenopausal HRT (hormone replacement therapy) 03/08/2015  . GERD (gastroesophageal reflux disease) 03/08/2015    Past Medical History  Diagnosis Date  . GERD (gastroesophageal reflux disease) 09/2011    Past Surgical History  Procedure Laterality Date  . Tonsillectomy and adenoidectomy  age 55  . Hysteroscopy  03/30/2008    resction of polyps and D&C  . Wisdom tooth extraction  age 55    Current Outpatient Prescriptions  Medication Sig  Dispense Refill  . doxycycline (VIBRAMYCIN) 100 MG capsule Take 1 capsule (100 mg total) by mouth 2 (two) times daily. 14 capsule 0  . methylPREDNISolone (MEDROL) 4 MG tablet Take 6 tabs day 1, 5 tabs day 2, 4 tabs day 3, 3 tabs day 4, 2 tabs day 5, 1 tab day 6 21 tablet 0   No current facility-administered medications for this visit.     ALLERGIES: Penicillins and Sulfa antibiotics  Family History  Problem Relation Age of Onset  . Atrial fibrillation Father   . Diabetes Father   . Heart disease Brother   . Colon cancer Maternal Grandmother 60    lung cancer  . Cancer Maternal Grandfather 29    brain cancer  . Rheum arthritis Paternal Grandmother     Social History   Social History  . Marital Status: Married    Spouse Name: N/A  . Number of Children: 0  . Years of Education: N/A   Occupational History  .     Social History Main Topics  . Smoking status: Never Smoker   . Smokeless tobacco: Never Used  . Alcohol Use: 1.2 oz/week    2 Standard drinks or equivalent per week  . Drug Use: No  . Sexual Activity: Yes    Birth Control/ Protection: Abstinence   Other Topics Concern  . Not on file  Social History Narrative    ROS:  Pertinent items are noted in HPI.  PHYSICAL EXAMINATION:    BP 110/64 mmHg  Pulse 60  Ht 5' 3.75" (1.619 m)  Wt 98 lb 6.4 oz (44.634 kg)  BMI 17.03 kg/m2  LMP 07/06/2015 (Exact Date)    General appearance: alert, cooperative and appears stated age   Abdomen: soft, non-tender, no masses,  no organomegaly  Pelvic: External genitalia:  no lesions              Urethra:  normal appearing urethra with no masses, tenderness or lesions              Bartholins and Skenes: normal                 Vagina: normal appearing vagina with normal color and discharge, no lesions              Cervix: no lesions           Bimanual Exam:  Uterus:  normal size, contour, position, consistency, mobility, non-tender              Adnexa: normal adnexa and no  mass, fullness, tenderness              Rectal exam: Yes.  .  Confirms.              Anus:  normal sphincter tone,  Hemorrhoids.  Chaperone was present for exam.  ASSESSMENT  Clitoromegaly.  Resolving with medrol dose pack and Doxycycline.  Seems to be infection and inflammatory based.  LLQ pain.  Perimenopausal female.  PLAN  Discussion of clitoromegaly and clinical picture of inflammation/cellulitis.  Continue Doxycycline and Medrol dose pack, and complete each.  Testosterone check today.  If testosterone level is normal, proceed with MRI of pelvis.  If testosterone level is elevated, proceed with MRI of pelvis and abdomen. Patient expressing appreciation for her care.    An After Visit Summary was printed and given to the patient.  ___25___ minutes face to face time of which over 50% was spent in counseling.

## 2015-08-05 LAB — TESTOSTERONE TOTAL,FREE,BIO, MALES
ALBUMIN: 4.5 g/dL (ref 3.6–5.1)
SEX HORMONE BINDING: 49 nmol/L (ref 17–124)
Testosterone: <20 ng/dL

## 2015-08-05 NOTE — Progress Notes (Signed)
The patient was seen and examined. She appears to have a cyst under her clitoral hood, 1 cm, not tender. She has a small palpable ? Small Lymph node above her clitoris, slightly tender. Will check a testosterone level, f/u in 1 month to see if the clitoris is enlarging.  Sumner Boast, MD

## 2015-08-06 ENCOUNTER — Telehealth: Payer: Self-pay | Admitting: Obstetrics and Gynecology

## 2015-08-06 DIAGNOSIS — N9089 Other specified noninflammatory disorders of vulva and perineum: Secondary | ICD-10-CM

## 2015-08-06 NOTE — Telephone Encounter (Signed)
Patient calling for results.

## 2015-08-06 NOTE — Telephone Encounter (Signed)
I do not anticipate results until Monday.  Lab results need to be run as female patient and this normally takes a few days. It was run initially as a female patient, and is not accurate. The lab is rerunning it.

## 2015-08-06 NOTE — Telephone Encounter (Signed)
Call to patient. Notified of information on lab results.

## 2015-08-06 NOTE — Telephone Encounter (Signed)
OV with Dr Quincy Simmonds on 08-04-15. Testosterone level drawn. Please advise.

## 2015-08-09 NOTE — Telephone Encounter (Signed)
Routing to Dr.Silva for review and advise of results from 08/02/2015.

## 2015-08-09 NOTE — Telephone Encounter (Signed)
Patient calling to check on the status of her results.

## 2015-08-09 NOTE — Telephone Encounter (Signed)
Dr Sabra Heck has returned to office, routing to Dr Sabra Heck for review.

## 2015-08-10 ENCOUNTER — Telehealth: Payer: Self-pay | Admitting: Obstetrics & Gynecology

## 2015-08-10 NOTE — Telephone Encounter (Signed)
Call to patient. Advised of Dr Ammie Ferrier recommendation to proceed with ultrasound. Scheduled for 08-19-15, patient declined earlier appointment due to personal schedule.  She is agreeable to PUS and recheck with Dr Sabra Heck.  Encounter closed.

## 2015-08-10 NOTE — Telephone Encounter (Signed)
Let's set her up for TVUS to assess ovaries.  I can examine her again at that time as well and we can discuss if any additional imaging will be helpful or appropriate.  Testosterone value is still pending.  Just checked with Laurie Griffith about this.

## 2015-08-10 NOTE — Telephone Encounter (Signed)
Spoke with pt regarding benefit for ultrasound. Patient understood and agreeable. Patient ready to schedule. Patient scheduled 08/19/15 with Dr Sabra Heck. Pt aware of arrival date and time. Pt aware of 72 hours cancellation policy with [id number] fee. No further questions. Ok to close

## 2015-08-11 NOTE — Telephone Encounter (Signed)
Late entry: During phone call on 08-10-15, patient was notified testosterone results WNL per verbal report from Dr Sabra Heck.

## 2015-08-11 NOTE — Telephone Encounter (Signed)
Just wanted to make sure pt knew the testosterone levels were normal.  The final result is in EPIC.  Thanks.

## 2015-08-14 LAB — TESTOS,TOTAL,FREE AND SHBG (FEMALE)
SEX HORMONE BINDING GLOB.: 37 nmol/L (ref 17–124)
Testosterone,Total,LC/MS/MS: <1 ng/dL — ABNORMAL LOW (ref 2–45)

## 2015-08-19 ENCOUNTER — Encounter: Payer: Self-pay | Admitting: Obstetrics & Gynecology

## 2015-08-19 ENCOUNTER — Ambulatory Visit (INDEPENDENT_AMBULATORY_CARE_PROVIDER_SITE_OTHER): Payer: BLUE CROSS/BLUE SHIELD | Admitting: Obstetrics & Gynecology

## 2015-08-19 ENCOUNTER — Ambulatory Visit (INDEPENDENT_AMBULATORY_CARE_PROVIDER_SITE_OTHER): Payer: BLUE CROSS/BLUE SHIELD

## 2015-08-19 VITALS — BP 118/66 | HR 60 | Resp 14 | Wt 101.0 lb

## 2015-08-19 DIAGNOSIS — R140 Abdominal distension (gaseous): Secondary | ICD-10-CM

## 2015-08-19 DIAGNOSIS — N9089 Other specified noninflammatory disorders of vulva and perineum: Secondary | ICD-10-CM

## 2015-08-19 MED ORDER — ESTRADIOL-NORETHINDRONE ACET 0.05-0.14 MG/DAY TD PTTW: 1.0000 | MEDICATED_PATCH | TRANSDERMAL | Status: DC

## 2015-08-19 NOTE — Progress Notes (Signed)
55 y.o. G21P0000 Married Caucasian female here for pelvic ultrasound due to clitoromegaly and concerns about possible relation to hormonal status.  Pt seen both 4/19 and then 4/21 with markedly enlarged clitoris.  She was ultimately treated with medrol dose pack and doxycycline.  Symptoms resolved very quickly with this.  Testosterone level was normal.  Due to he concerns about cause of this and possible recurrence, PUS discussed.  She is here for this today.  Patient's last menstrual period was 07/06/2015 (exact date).  Contraception: abstinance  Findings:  UTERUS: 6.5 x 4.5 x 3.3cm EMS: 1.54mm ADNEXA: Left ovary: 2.3 x 1.9 x 1.6cm       Right ovary: 2.3 x 1.9 x 1.4cm CUL DE SAC: mild to moderate clear fluid in PCDS  Physical Exam  Constitutional: She is oriented to person, place, and time. She appears well-developed and well-nourished.  Genitourinary: Vagina normal.    There is no rash, tenderness, lesion or injury on the right labia. There is no rash, tenderness, lesion or injury on the left labia.  Lymphadenopathy:       Right: No inguinal adenopathy present.       Left: No inguinal adenopathy present.  Neurological: She is alert and oriented to person, place, and time.  Skin: Skin is warm and dry.  Psychiatric: She has a normal mood and affect.   Discussion:  With normal exam, normal testosterone level, and resolution of symptoms, do not feel additional testing is required.  Pt reassured.  If any symptom returns, will proceed with pelvic MRI.  Pt with long term issues with GI symptoms.  Has decided to try HRT.  Aware of risks as we have reivewed at prior visit.  Combipathc 0.05/0.14mg  twice weekly to skin.  Pt will give update.  Assessment:  clitoromegaly  GI issues with abdominal bloating Normal PUS today except for mild/moderate free fluid in PCDS  Plan:  Pt will return for AEX 9/17 or if there are any issues before that time  ~15 minutes spent with patient >50% of time was  in face to face discussion of above.

## 2015-08-20 DIAGNOSIS — Z1231 Encounter for screening mammogram for malignant neoplasm of breast: Secondary | ICD-10-CM | POA: Diagnosis not present

## 2015-09-22 DIAGNOSIS — F4322 Adjustment disorder with anxiety: Secondary | ICD-10-CM | POA: Diagnosis not present

## 2015-09-27 DIAGNOSIS — F4322 Adjustment disorder with anxiety: Secondary | ICD-10-CM | POA: Diagnosis not present

## 2015-10-04 DIAGNOSIS — H1789 Other corneal scars and opacities: Secondary | ICD-10-CM | POA: Diagnosis not present

## 2015-10-04 DIAGNOSIS — H52203 Unspecified astigmatism, bilateral: Secondary | ICD-10-CM | POA: Diagnosis not present

## 2015-10-06 DIAGNOSIS — F411 Generalized anxiety disorder: Secondary | ICD-10-CM | POA: Diagnosis not present

## 2015-10-19 DIAGNOSIS — F4323 Adjustment disorder with mixed anxiety and depressed mood: Secondary | ICD-10-CM | POA: Diagnosis not present

## 2015-11-02 DIAGNOSIS — F4322 Adjustment disorder with anxiety: Secondary | ICD-10-CM | POA: Diagnosis not present

## 2015-11-17 DIAGNOSIS — H00016 Hordeolum externum left eye, unspecified eyelid: Secondary | ICD-10-CM | POA: Diagnosis not present

## 2015-11-17 DIAGNOSIS — H2513 Age-related nuclear cataract, bilateral: Secondary | ICD-10-CM | POA: Diagnosis not present

## 2015-11-17 DIAGNOSIS — H16223 Keratoconjunctivitis sicca, not specified as Sjogren's, bilateral: Secondary | ICD-10-CM | POA: Diagnosis not present

## 2015-11-17 DIAGNOSIS — H18452 Nodular corneal degeneration, left eye: Secondary | ICD-10-CM | POA: Diagnosis not present

## 2015-11-19 DIAGNOSIS — F4323 Adjustment disorder with mixed anxiety and depressed mood: Secondary | ICD-10-CM | POA: Diagnosis not present

## 2015-12-03 DIAGNOSIS — F4323 Adjustment disorder with mixed anxiety and depressed mood: Secondary | ICD-10-CM | POA: Diagnosis not present

## 2015-12-04 DIAGNOSIS — H16223 Keratoconjunctivitis sicca, not specified as Sjogren's, bilateral: Secondary | ICD-10-CM | POA: Diagnosis not present

## 2015-12-04 DIAGNOSIS — H00016 Hordeolum externum left eye, unspecified eyelid: Secondary | ICD-10-CM | POA: Diagnosis not present

## 2015-12-04 DIAGNOSIS — H18452 Nodular corneal degeneration, left eye: Secondary | ICD-10-CM | POA: Diagnosis not present

## 2015-12-04 DIAGNOSIS — H2513 Age-related nuclear cataract, bilateral: Secondary | ICD-10-CM | POA: Diagnosis not present

## 2015-12-23 DIAGNOSIS — F4323 Adjustment disorder with mixed anxiety and depressed mood: Secondary | ICD-10-CM | POA: Diagnosis not present

## 2015-12-28 ENCOUNTER — Ambulatory Visit: Payer: BLUE CROSS/BLUE SHIELD | Admitting: Nurse Practitioner

## 2015-12-28 DIAGNOSIS — R110 Nausea: Secondary | ICD-10-CM | POA: Diagnosis not present

## 2015-12-28 DIAGNOSIS — R140 Abdominal distension (gaseous): Secondary | ICD-10-CM | POA: Diagnosis not present

## 2015-12-28 DIAGNOSIS — R1013 Epigastric pain: Secondary | ICD-10-CM | POA: Diagnosis not present

## 2015-12-28 DIAGNOSIS — K219 Gastro-esophageal reflux disease without esophagitis: Secondary | ICD-10-CM | POA: Diagnosis not present

## 2015-12-28 DIAGNOSIS — Z23 Encounter for immunization: Secondary | ICD-10-CM | POA: Diagnosis not present

## 2015-12-31 ENCOUNTER — Ambulatory Visit (INDEPENDENT_AMBULATORY_CARE_PROVIDER_SITE_OTHER): Payer: BLUE CROSS/BLUE SHIELD | Admitting: Obstetrics & Gynecology

## 2015-12-31 ENCOUNTER — Telehealth: Payer: Self-pay | Admitting: *Deleted

## 2015-12-31 ENCOUNTER — Encounter: Payer: Self-pay | Admitting: Obstetrics & Gynecology

## 2015-12-31 VITALS — BP 92/64 | HR 64 | Resp 12 | Ht 63.5 in | Wt 98.4 lb

## 2015-12-31 DIAGNOSIS — Z0000 Encounter for general adult medical examination without abnormal findings: Secondary | ICD-10-CM

## 2015-12-31 DIAGNOSIS — K589 Irritable bowel syndrome without diarrhea: Secondary | ICD-10-CM | POA: Diagnosis not present

## 2015-12-31 DIAGNOSIS — Z01419 Encounter for gynecological examination (general) (routine) without abnormal findings: Secondary | ICD-10-CM | POA: Diagnosis not present

## 2015-12-31 DIAGNOSIS — K5909 Other constipation: Secondary | ICD-10-CM

## 2015-12-31 DIAGNOSIS — K219 Gastro-esophageal reflux disease without esophagitis: Secondary | ICD-10-CM

## 2015-12-31 LAB — POCT URINALYSIS DIPSTICK
Bilirubin, UA: NEGATIVE
Blood, UA: NEGATIVE
GLUCOSE UA: NEGATIVE
Ketones, UA: NEGATIVE
LEUKOCYTES UA: NEGATIVE
NITRITE UA: NEGATIVE
PROTEIN UA: NEGATIVE
UROBILINOGEN UA: NEGATIVE
pH, UA: 7

## 2015-12-31 NOTE — Telephone Encounter (Signed)
Zenovia Jarred  Reply all   Tue 9/19, 5:04 PM  Dixon Boos  This patient needs to be seen by me as new pt.    From: Dr [email or web address] Date: December 28, 2015 at 4:21:41 PM EDT To: [email or web address]> Subject: Patient   Hi Ulice Dash,  Thanks so much for giving me a call back today. Glad to hear you're doing well!  Here's the contact info for the patient we spoke about.  1960/11/11 DOB  [address], [place] [address]  [phone] (cell)  [email or web address]

## 2015-12-31 NOTE — Telephone Encounter (Signed)
Patient has been advised that she is scheduled for an appointment with Dr Hilarie Fredrickson by Elmer Ramp, [facility]. She verbalized understanding.

## 2015-12-31 NOTE — Progress Notes (Signed)
55 y.o. G0P0000 MarriedCaucasianF here for annual exam.  Doing well.  Has noted that she has figured out that when she has hot flashes she is also feeling a significant amount of reflux.  Not sure which is cause and effect.  Notes when she starts to have a night sweat, will almost simultaneously feel "bubbling up of acid".  Pt is thin and eats very well.  Has been having the reflux symptoms for 5 or more years.  The menopausal symptoms have been in the last year.    She is still cycling some.  Has skipped up to five months but had normal cycle in June and July.    She has also had an episode of bright red rectal bleeding with a bowel movement in early July.  This happened again three weeks ago.  Pt has some constipation and does have to strain.  Had a colonoscopy 3/15 with [facility].  She's had lots of tests around GI function and GI issues.  Would like to be referred to [facility].  I am in complete agreement with this plan.  We have discussed in the past a trial of HRT just to see if this would help but she's decided that she feels GI is really the issue and perimenopausal symptoms are just related in time and not in any other way.  Patient's last menstrual period was 11/05/2015.          Sexually active: No.  The current method of family planning is none.    Exercising: Yes.    walking Smoker:  no  Health Maintenance: Pap:  12/16/14 negative, HR HPV negative History of abnormal Pap:  no MMG:  08/20/15 BIRADS 1 negative Colonoscopy:  2013 repeat 10 years  BMD:   never TDaP:  2016  Pneumonia vaccine(s):  never Zostavax:   never Hep C testing: Negative.  Copy of result is in media tab. Screening Labs: declined, Hb today: declined, Urine today: normal    reports that she has never smoked. She has never used smokeless tobacco. She reports that she drinks about 1.2 oz of alcohol per week . She reports that she does not use drugs.  Past Medical History:  Diagnosis Date  . GERD (gastroesophageal  reflux disease) 09/2011    Past Surgical History:  Procedure Laterality Date  . HYSTEROSCOPY  03/30/2008   resction of polyps and D&C  . TONSILLECTOMY AND ADENOIDECTOMY  age 12  . WISDOM TOOTH EXTRACTION  age 65    Family History  Problem Relation Age of Onset  . Atrial fibrillation Father   . Diabetes Father   . Heart disease Brother   . Colon cancer Maternal Grandmother 60    lung cancer  . Cancer Maternal Grandfather 65    brain cancer  . Rheum arthritis Paternal Grandmother     ROS:  Pertinent items are noted in HPI.  Otherwise, a comprehensive ROS was negative.  Exam:   BP 92/64 (BP Location: Right Arm, Patient Position: Sitting, Cuff Size: Normal)   Pulse 64   Resp 12   Ht 5' 3.5" (1.613 m)   Wt 98 lb 6.4 oz (44.6 kg)   LMP 11/05/2015   BMI 17.16 kg/m   Weight change: -1#    Height: 5' 3.5" (161.3 cm)  Ht Readings from Last 3 Encounters:  12/31/15 5' 3.5" (1.613 m)  08/04/15 5' 3.75" (1.619 m)  07/30/15 5' 3.75" (1.619 m)    General appearance: alert, cooperative and appears stated  age Head: Normocephalic, without obvious abnormality, atraumatic Neck: no adenopathy, supple, symmetrical, trachea midline and thyroid normal to inspection and palpation Lungs: clear to auscultation bilaterally Breasts: normal appearance, no masses or tenderness Heart: regular rate and rhythm Abdomen: soft, non-tender; bowel sounds normal; no masses,  no organomegaly Extremities: extremities normal, atraumatic, no cyanosis or edema Skin: Skin color, texture, turgor normal. No rashes or lesions Lymph nodes: Cervical, supraclavicular, and axillary nodes normal. No abnormal inguinal nodes palpated Neurologic: Grossly normal   Pelvic: External genitalia:  no lesions              Urethra:  normal appearing urethra with no masses, tenderness or lesions              Bartholins and Skenes: normal                 Vagina: normal appearing vagina with normal color and discharge, no  lesions, atrophic changes present              Cervix: no lesions              Pap taken: No. Bimanual Exam:  Uterus:  normal size, contour, position, consistency, mobility, non-tender              Adnexa: normal adnexa and no mass, fullness, tenderness               Rectovaginal: Confirms               Anus:  normal sphincter tone, no lesions  Chaperone was present for exam.  A:  Well Woman with normal exam Perimenopausal symptoms Complex GI issues including constipation, IBS like symptoms, GERD.  Has seen Dr. Collene Mares and [facility] locally.  P:        Yearly MMG reviewed Pap not indicated this year.  Pap neg and neg HR HPV 2016. Labs done this year with Dr. Nyoka Cowden including neg Hep C Referral to [place], motility specialist. Return annually or prn

## 2015-12-31 NOTE — Telephone Encounter (Signed)
Patient has been scheduled for an appointment on 01/19/16 with Dr Hilarie Fredrickson. I have left a message with a female for patient to call back.

## 2016-01-05 DIAGNOSIS — F4323 Adjustment disorder with mixed anxiety and depressed mood: Secondary | ICD-10-CM | POA: Diagnosis not present

## 2016-01-11 ENCOUNTER — Encounter: Payer: Self-pay | Admitting: *Deleted

## 2016-01-11 ENCOUNTER — Other Ambulatory Visit: Payer: Self-pay | Admitting: *Deleted

## 2016-01-12 ENCOUNTER — Telehealth: Payer: Self-pay | Admitting: Obstetrics & Gynecology

## 2016-01-12 NOTE — Telephone Encounter (Signed)
Patient calling to check the status of a referral.

## 2016-01-13 NOTE — Telephone Encounter (Signed)
Patient called again to check on the status of the referral.

## 2016-01-19 ENCOUNTER — Encounter: Payer: Self-pay | Admitting: Internal Medicine

## 2016-01-19 ENCOUNTER — Ambulatory Visit (INDEPENDENT_AMBULATORY_CARE_PROVIDER_SITE_OTHER): Payer: BLUE CROSS/BLUE SHIELD | Admitting: Internal Medicine

## 2016-01-19 VITALS — BP 102/64 | HR 64 | Ht 63.0 in | Wt 99.6 lb

## 2016-01-19 DIAGNOSIS — K30 Functional dyspepsia: Secondary | ICD-10-CM | POA: Diagnosis not present

## 2016-01-19 DIAGNOSIS — R6881 Early satiety: Secondary | ICD-10-CM

## 2016-01-19 DIAGNOSIS — K219 Gastro-esophageal reflux disease without esophagitis: Secondary | ICD-10-CM

## 2016-01-19 NOTE — Patient Instructions (Addendum)
Please follow up with Dr Hilarie Fredrickson on Wednesday, 04/12/16 @ 2:45 pm.  If you are age 55 or older, your body mass index should be between 23-30. Your Body mass index is 17.64 kg/m. If this is out of the aforementioned range listed, please consider follow up with your Primary Care Provider.  If you are age 10 or younger, your body mass index should be between 19-25. Your Body mass index is 17.64 kg/m. If this is out of the aformentioned range listed, please consider follow up with your Primary Care Provider.

## 2016-01-19 NOTE — Progress Notes (Signed)
HPI: Laurie Griffith is a 55 year old female with past medical history of GERD, chronic epigastric pain who is seen to discuss her chronic upper GI symptoms. She is here alone today. She was referred to me through a relative who works in Nurse, children's at [employer].  She presents alone today.  She reports that over the last 5-6 years she's been dealing with a multitude of upper GI symptoms. She has been evaluated at Dr. Penelope Coop at [facility] and Dr. Collene Mares with [facility]. She reports she's had numerous prior testing and multiple medication trials none of which is proved very helpful. She states that her symptoms have been inconsistent but primarily involve reflux with burning substernal chest pain, epigastric abdominal pain and early satiety. She feels that stress exacerbates her symptoms and that she may have "hypersensitivity" and "vigilant response" to stressors. She's tried cognitive behavioral therapy and is seeing a therapist. She states that diet and the timing of her eating will exacerbate her symptoms. If she eats out particularly in the evening symptoms tend to be worse. She props up the head of her bed on night such as this. At times the quantity of her food, particularly larger meal seemed to exacerbate symptoms. Recently blue cheese has become a trigger. Over the last 5 year she's lost about 6-7 pounds but no dramatic changes in weight. Nausea and vomiting have not been an issue. Several months ago she was given doxycycline for an eye condition which worsened her symptoms "tenfold". She has been off of this medication and symptoms have returned more to her baseline. She reports her bowel movements are rarely affected though she does have hemorrhoids. She states she rarely gets constipated otherwise bowel movements are regular without blood or melena. When hemorrhoids flare she has issues with prolapse and red blood with wiping. This has not been an issue of late.  She is currently taking ultra flora  probiotic and restora, in the morning and at night, respectively. She's not sure that this helps much. In the past she recalls trying Dexilant, omeprazole, Nexium, Zantac, and Carafate. None of these helped. Carafate was tried most recently and worsened her symptoms significantly after 1 or 2 doses. She is not currently taking antacid therapy.  She reports her last menstrual cycle was in July 2017. She's noticed some night sweats which she relates to perimenopause.  Prior workup she reports has included EGD and colonoscopy in 2013. She recalls these being normal. These were performed by Dr. Penelope Coop and I do not have the report. I do have the pathology report which I have reviewed from this procedure. Gastric biopsies showed mild chronic inactive gastritis, negative for H. pylori metaplasia or dysplasia or malignancy. Small bowel biopsies were benign with no evidence of celiac disease. She reports colonoscopy in 2013 was normal.  She's had ultrasound of her abdomen, CT scan of her abdomen and HIDA scan all negative. Gastric emptying study was normal.  She reports she has self diagnosed through research with "nonacid reflux, visceral hypersensitivity and functional dyspepsia".  Past Medical History:  Diagnosis Date  . Cyst of left kidney 07/24/2014   simple appearing left mid kidney cyst  . GERD (gastroesophageal reflux disease) 09/2011  . Hiatal hernia   . Pericardial effusion 07/24/2014   trace anterior  . Uterine fibroid 07/24/2014   2.4 cm subserosal left fundal    Past Surgical History:  Procedure Laterality Date  . HYSTEROSCOPY  03/30/2008   resction of polyps and D&C  . TONSILLECTOMY  AND ADENOIDECTOMY  age 4  . WISDOM TOOTH EXTRACTION  age 38    Outpatient Medications Prior to Visit  Medication Sig Dispense Refill  . estradiol-norethindrone (COMBIPATCH) 0.05-0.14 MG/DAY Place 1 patch onto the skin 2 (two) times a week. (Patient not taking: Reported on 12/31/2015) 8 patch 12   No  facility-administered medications prior to visit.     Allergies  Allergen Reactions  . Doxycycline Nausea Only  . Penicillins Rash  . Sulfa Antibiotics Rash    Family History  Problem Relation Age of Onset  . Atrial fibrillation Father   . Diabetes Father   . Heart disease Brother   . Colon cancer Maternal Grandmother 60    lung cancer  . Cancer Maternal Grandfather 39    brain cancer  . Rheum arthritis Paternal Grandmother     Social History  Substance Use Topics  . Smoking status: Never Smoker  . Smokeless tobacco: Never Used  . Alcohol use 1.2 oz/week    2 Standard drinks or equivalent per week    ROS: As per history of present illness, otherwise negative  BP 102/64 (BP Location: Left Arm, Patient Position: Sitting, Cuff Size: Normal)   Pulse 64   Ht 5\' 3"  (1.6 m)   Wt 99 lb 9.6 oz (45.2 kg)   LMP 11/05/2015 (Approximate)   BMI 17.64 kg/m  Constitutional: Well-developed and well-nourished. No distress. HEENT: Normocephalic and atraumatic. Oropharynx is clear and moist. No oropharyngeal exudate. Conjunctivae are normal.  No scleral icterus. Neck: Neck supple. Trachea midline. Cardiovascular: Normal rate, regular rhythm and intact distal pulses. No M/R/G Pulmonary/chest: Effort normal and breath sounds normal. No wheezing, rales or rhonchi. Abdominal: Soft, nontender, nondistended. Bowel sounds active throughout. There are no masses palpable. No hepatosplenomegaly. Extremities: no clubbing, cyanosis, or edema Lymphadenopathy: No cervical adenopathy noted. Neurological: Alert and oriented to person place and time. Skin: Skin is warm and dry. No rashes noted. Psychiatric: Normal mood and affect. Behavior is normal.  RELEVANT IMAGING: CT ABDOMEN AND PELVIS WITH CONTRAST   TECHNIQUE: Multidetector CT imaging of the abdomen and pelvis was performed using the standard protocol following bolus administration of intravenous contrast.   CONTRAST:  43mL OMNIPAQUE  IOHEXOL 300 MG/ML  SOLN   COMPARISON:  10/05/2011   FINDINGS: Lower chest: Small type 1 hiatal hernia. Trace anterior pericardial effusion.   Hepatobiliary: Initial imaging ended up being in the early arterial phase. The portal veins and hepatic veins are not opacified on the initial imaging but the visualized portal and hepatic veins appear normal on the delayed images. No abnormal arterial phase enhancement in the liver.   Pancreas: Unremarkable   Spleen: Unremarkable   Adrenals/Urinary Tract: Simple appearing left mid kidney cyst, 2.3 by 1.7 cm.   Stomach/Bowel: Appendix not well seen. Terminal ileum unremarkable. Prominent stool throughout the colon favors constipation.   Vascular/Lymphatic: Unremarkable   Reproductive: 2.4 cm subserosal left fundal fibroid.   Other: No supplemental non-categorized findings.   Musculoskeletal: Unremarkable   IMPRESSION: 1.  Prominent stool throughout the colon favors constipation. 2. Small type 1 hiatal hernia. 3. Trace anterior pericardial effusion. 4. 2.4 cm subserosal left fundal uterine fibroid. 5. Simple appearing left mid kidney cyst.     Electronically Signed   By: Van Clines M.D.   On: 07/24/2014 17:03   CLINICAL DATA:  Chronic upper abdominal pain   EXAM: ULTRASOUND ABDOMEN COMPLETE   COMPARISON:  CT abdomen and pelvis July 24, 2014   FINDINGS: Gallbladder: No gallstones  or wall thickening visualized. There is no pericholecystic fluid. No sonographic Murphy sign noted.   Common bile duct: Diameter: 3 mm. No intrahepatic, common hepatic, or common bile duct dilatation.   Liver: No focal lesion identified. Within normal limits in parenchymal echogenicity.   IVC: No abnormality visualized.   Pancreas: No mass or inflammatory focus.   Spleen: Size and appearance within normal limits.   Right Kidney: Length: 10.8 cm. Echogenicity within normal limits. No mass or hydronephrosis visualized.   Left  Kidney: Length: 10.2 cm. Echogenicity within normal limits. No hydronephrosis visualized. There is a cyst arising from the mid left kidney measuring 1.7 x 1.8 x 1.8 cm.   Abdominal aorta: No aneurysm visualized.   Other findings: No demonstrable ascites.   IMPRESSION: Left renal cyst, demonstrated on prior CT. Study otherwise unremarkable.     Electronically Signed   By: Lowella Grip III M.D.   On: 02/12/2015 09:04 NUCLEAR MEDICINE HEPATOBILIARY IMAGING WITH GALLBLADDER EF   TECHNIQUE: Sequential images of the abdomen were obtained out to 60 minutes following intravenous administration of radiopharmaceutical. After slow intravenous infusion of 0.9 micrograms Cholecystokinin, gallbladder ejection fraction was determined.   RADIOPHARMACEUTICALS:  5.1 mCi Tc-43m Choletec IV   COMPARISON:  Ultrasound performed today.   FINDINGS: There is prompt uptake and excretion of radiotracer by the liver. No evidence of cystic duct or common bile duct obstruction. Ejection fraction 96%. At 45 min, normal ejection fraction is greater than 40%.   IMPRESSION: Normal study.     Electronically Signed   By: Rolm Baptise M.D.   On: 02/12/2015 14:46   NUCLEAR MEDICINE GASTRIC EMPTYING SCAN   TECHNIQUE: After oral ingestion of radiolabeled meal, sequential abdominal images were obtained for 120 minutes. Residual percentage of activity remaining within the stomach was calculated at 60 and 120 minutes.   RADIOPHARMACEUTICALS:  2.0 mCi Tc-26m MDP labeled sulfur colloid in egg orally   COMPARISON:  None.   FINDINGS: Expected location of the stomach in the left upper quadrant. Ingested meal empties the stomach gradually over the course of the study with 28.2% retention at 60 min and 7.6% retention at 120 min (normal retention less than 30% at a 120 min).   IMPRESSION: Normal gastric emptying study.     Electronically Signed   By: Lowella Grip III M.D.   On: 03/02/2015  10:34     ASSESSMENT/PLAN: 55 year old female with past medical history of GERD, chronic epigastric pain who is seen to discuss her chronic upper GI symptoms.   1. Chronic epigastric pain, early satiety, GERD -- while I have not specifically reviewed the EGD and colonoscopy report, if her recollection is correct she has had an extensive workup of her upper GI symptoms without definitive diagnosis. Her symptoms are consistent with functional disease, and are most consistent with functional dyspepsia likely with an overlap of GERD. We have discussed this at length today and discussed several treatment options going forward. We have requested records from both Dr. Penelope Coop and Dr. Collene Mares for my review and for completeness. I have also reviewed the radiology studies as discussed above. We discussed functional dyspepsia and how it can overlap with stress. She very well may have a component of visceral hypersensitivity. We discussed 24-hour pH and impedance to definitively answer the question regarding acid reflux and to determine symptom correlation and possibly the need for acid suppression therapy. That said, she did not respond favorably to PPI or H2 blocker in the past. --Options for treatment  which we have discussed at length include trial of metoclopramide therapy for functional dyspepsia. This would be does 5-10 mg 3 times a day before meals and at bedtime.  We discussed the rare but possibility of tardive dyskinesia with his medication issues long-term. I think the risks of a trial of this medication that short-term are very low and if she did respond favorably she could be switched to domperidone. She would need EKG first. We also discussed the possibility of amitriptyline for this role hypersensitivity and functional dyspepsia. She would like to research this further and discuss with her husband before making a decision. She stated she found our discussion very helpful and she will be back in touch with me  to make treatment decisions going forward. For now she will continue her probiotic. We will await records for review.  2. CRC screening -- 1 secondary relative with colon cancer. No first-degree relatives with colon cancer or advance polyps. 10 year surveillance interval appropriate.  I'll have her follow-up in December or January, sooner if needed

## 2016-02-12 DIAGNOSIS — H18452 Nodular corneal degeneration, left eye: Secondary | ICD-10-CM | POA: Diagnosis not present

## 2016-03-17 DIAGNOSIS — K5900 Constipation, unspecified: Secondary | ICD-10-CM | POA: Insufficient documentation

## 2016-03-17 DIAGNOSIS — K589 Irritable bowel syndrome without diarrhea: Secondary | ICD-10-CM | POA: Diagnosis not present

## 2016-03-17 DIAGNOSIS — K30 Functional dyspepsia: Secondary | ICD-10-CM | POA: Diagnosis not present

## 2016-03-17 DIAGNOSIS — K219 Gastro-esophageal reflux disease without esophagitis: Secondary | ICD-10-CM | POA: Diagnosis not present

## 2016-03-19 DIAGNOSIS — F4323 Adjustment disorder with mixed anxiety and depressed mood: Secondary | ICD-10-CM | POA: Diagnosis not present

## 2016-04-12 ENCOUNTER — Ambulatory Visit: Payer: BLUE CROSS/BLUE SHIELD | Admitting: Internal Medicine

## 2016-05-02 ENCOUNTER — Ambulatory Visit: Payer: BLUE CROSS/BLUE SHIELD | Admitting: Internal Medicine

## 2016-05-24 ENCOUNTER — Telehealth: Payer: Self-pay | Admitting: Obstetrics & Gynecology

## 2016-05-24 DIAGNOSIS — R234 Changes in skin texture: Secondary | ICD-10-CM | POA: Diagnosis not present

## 2016-05-24 DIAGNOSIS — L700 Acne vulgaris: Secondary | ICD-10-CM | POA: Diagnosis not present

## 2016-05-24 DIAGNOSIS — L2084 Intrinsic (allergic) eczema: Secondary | ICD-10-CM | POA: Diagnosis not present

## 2016-05-24 NOTE — Telephone Encounter (Signed)
Spoke with patient. Patient is hesitant to triage. States "I have a problem starting that happened before and Dr.Miller treated me with an antibiotic." Patient has been seen for clitoromegaly on 08/19/2015 with Dr.Miller, 08/04/2015 with Dr.Silva, 4/21/21017 with Dr.Miller, and 07/28/2015 with Kem Boroughs, FNP. Reports clitoral area is getting large again and would like to schedule an OV with Dr.Miller for evaluation. Declines any pain or other symptoms at this time. Declines earlier appointment. Appointment scheduled for 05/26/2016 at 2:45 pm with Dr.Miller. Agreeable to date and time. Aware if symptoms worsen or develops new symptoms she will need to be seen for further evaluation earlier.  Routing to provider for final review. Patient agreeable to disposition. Will close encounter.

## 2016-05-24 NOTE — Telephone Encounter (Signed)
Patient states she is having the same issue as before and wants to come in to see Dr. Sabra Heck. She states this was something Dr. Sabra Heck had never seen before but treated her with an antibiotic.

## 2016-05-26 ENCOUNTER — Encounter: Payer: Self-pay | Admitting: Obstetrics & Gynecology

## 2016-05-26 ENCOUNTER — Ambulatory Visit (INDEPENDENT_AMBULATORY_CARE_PROVIDER_SITE_OTHER): Payer: BLUE CROSS/BLUE SHIELD | Admitting: Obstetrics & Gynecology

## 2016-05-26 VITALS — BP 118/70 | HR 66 | Resp 14 | Ht 63.75 in | Wt 101.8 lb

## 2016-05-26 DIAGNOSIS — N9089 Other specified noninflammatory disorders of vulva and perineum: Secondary | ICD-10-CM

## 2016-05-26 MED ORDER — METHYLPREDNISOLONE 4 MG PO TABS: ORAL_TABLET | ORAL | 0 refills | Status: DC

## 2016-05-26 NOTE — Progress Notes (Signed)
GYNECOLOGY  VISIT   HPI: 56 y.o. G56P0000 Married Caucasian female here for complaint of increased sensation and size of her clitoris.  Pt had something similar that occurred last year in late April.  At that time, the clitoromegaly was quite impressive.  She was treated initially with topical steroid which did not help.  She was then treated with oral steroids and doxycycline.  Pt felt oral steroids made the most difference.  Ultrasound was done as well at that time.  Pt felt she she's been feeling a little increased size and tenderness in the clitoris over the last three days.  Today, she feels her symptoms are better.  She denies vaginal bleeding.  Her last bleeding was in June and July.    She is still having GI issues.  She did see specialist at [facility] but was dissatisfied with the visit.  She is appreciative that I would get her referred there, however, she will likely not go back.  She did go and see another GI at [facility].  She and I have talked about a trial of HRT in the past as pt's GI issues started as she began to transition into her perimenopausal time.  She wants to discuss this again.  Advised I would just start with estradiol patch for one to two months before adding oral progesterone or transitioning to [place].  Risks reviewed.  She is still considering this.  GYNECOLOGIC HISTORY: Patient's last menstrual period was 11/04/2015. Contraception: abstinence  Menopausal hormone therapy: none  Patient Active Problem List   Diagnosis Date Noted  . GERD (gastroesophageal reflux disease) 03/08/2015    Past Medical History:  Diagnosis Date  . Cyst of left kidney 07/24/2014   simple appearing left mid kidney cyst  . GERD (gastroesophageal reflux disease) 09/2011  . Hiatal hernia   . Pericardial effusion 07/24/2014   trace anterior  . Uterine fibroid 07/24/2014   2.4 cm subserosal left fundal    Past Surgical History:  Procedure Laterality Date  . HYSTEROSCOPY  03/30/2008    resction of polyps and D&C  . TONSILLECTOMY AND ADENOIDECTOMY  age 60  . WISDOM TOOTH EXTRACTION  age 56    MEDS:  Reviewed in EPIC and UTD  ALLERGIES: Doxycycline; Penicillins; and Sulfa antibiotics  Family History  Problem Relation Age of Onset  . Atrial fibrillation Father   . Diabetes Father   . Heart disease Brother   . Colon cancer Maternal Grandmother 60    lung cancer  . Cancer Maternal Grandfather 69    brain cancer  . Rheum arthritis Paternal Grandmother     SH:  Married, not SA  Review of Systems  Gastrointestinal: Positive for abdominal pain.  Genitourinary:       Clitoral tenderness and enlargement  All other systems reviewed and are negative.   PHYSICAL EXAMINATION:    BP 118/70 (BP Location: Right Arm, Patient Position: Sitting, Cuff Size: Normal)   Pulse 66   Resp 14   Ht 5' 3.75" (1.619 m)   Wt 101 lb 12.8 oz (46.2 kg)   LMP 11/04/2015   BMI 17.61 kg/m     Physical Exam  Constitutional: She is oriented to person, place, and time. She appears well-developed and well-nourished.  Genitourinary: Vagina normal. No labial fusion. There is no rash, tenderness, lesion or injury on the right labia. There is no rash, tenderness, lesion or injury on the left labia.  Genitourinary Comments: Clitoris appear normal today.  Mild tenderness to palpation.  Lymphadenopathy:       Right: No inguinal adenopathy present.       Left: No inguinal adenopathy present.  Neurological: She is alert and oriented to person, place, and time.  Skin: Skin is warm and dry.  Psychiatric: She has a normal mood and affect.    Chaperone was present for exam.  Assessment: H/O significant clitoromegaly about 10 months ago, unknown etiology.  Possible beginning of same thing again this week. Perimenopausal with last bleeding episode in July.  Do not feel this needs to be treated at this time.  However, as it is Friday, rx for Medrol dose pack will be given in case pt needs  this.  Plan: Rx for Medrol dose pack given to pt if symptoms change.  She knows to call if this occurs for follow-up. She also knows if she decides to start HRT, she will need to call.    ~25 minutes spent with patient >50% of time was in face to face discussion of above.

## 2016-05-31 DIAGNOSIS — H0016 Chalazion left eye, unspecified eyelid: Secondary | ICD-10-CM | POA: Diagnosis not present

## 2016-05-31 DIAGNOSIS — H16223 Keratoconjunctivitis sicca, not specified as Sjogren's, bilateral: Secondary | ICD-10-CM | POA: Diagnosis not present

## 2016-05-31 DIAGNOSIS — H2513 Age-related nuclear cataract, bilateral: Secondary | ICD-10-CM | POA: Diagnosis not present

## 2016-05-31 DIAGNOSIS — H18452 Nodular corneal degeneration, left eye: Secondary | ICD-10-CM | POA: Diagnosis not present

## 2016-06-12 ENCOUNTER — Telehealth: Payer: Self-pay | Admitting: Internal Medicine

## 2016-06-12 NOTE — Telephone Encounter (Signed)
Spoke with pt and she is aware.

## 2016-06-12 NOTE — Telephone Encounter (Signed)
Pt was referred to a GI psychologist by [facility]. Pt states the GI psychologist would like for her to have a Hydrogen breath test done and she is calling to see if Dr. Hilarie Fredrickson can order this test for her. Please advise.

## 2016-06-12 NOTE — Telephone Encounter (Signed)
On second thought, this test would be best performed by her current GI MD in [facility] as this is where she is being seen and managed

## 2016-06-12 NOTE — Telephone Encounter (Signed)
This test can be ordered via [facility]  The test will be mailed and done at home We do not offer this an an office based test at this time Carla Drape or [employer] has info on how to order

## 2016-08-02 DIAGNOSIS — H16223 Keratoconjunctivitis sicca, not specified as Sjogren's, bilateral: Secondary | ICD-10-CM | POA: Diagnosis not present

## 2016-08-11 ENCOUNTER — Telehealth: Payer: Self-pay | Admitting: Internal Medicine

## 2016-08-11 NOTE — Telephone Encounter (Signed)
Pt is calling to see if Dr. Hilarie Fredrickson will order a breath test to see if she has to much sugar in her system. Pt was referred to the gastro psychologist, Dr. Cheral Marker, by [facility] Dr. Milagros Loll. Told pt the gastro psychologist should order the test, she states she doesn't think he is licensed to do so. Then discussed with pt that she should get the doctor that referred her to the GI psych to order it, pt states she is not interested in seeing them. Let pt know that I would have to check with Dr. Hilarie Fredrickson and see what he said. Please advise.

## 2016-08-11 NOTE — Telephone Encounter (Signed)
We do not have hydrogen breath testing for bacterial overgrowth in our office or hospital [facility] and [place] offer this test Would refer her there given lack of availability of hydrogen breath testing here

## 2016-08-11 NOTE — Telephone Encounter (Signed)
Pt aware.

## 2016-08-11 NOTE — Telephone Encounter (Signed)
Would need to know what blood tests are being requested

## 2016-08-25 ENCOUNTER — Telehealth: Payer: Self-pay | Admitting: Obstetrics & Gynecology

## 2016-08-25 MED ORDER — ESTRADIOL-NORETHINDRONE ACET 0.05-0.25 MG/DAY TD PTTW: 1.0000 | MEDICATED_PATCH | TRANSDERMAL | 2 refills | Status: DC

## 2016-08-25 NOTE — Telephone Encounter (Signed)
Spoke with patient, advised as seen below per Dr. Sabra Heck. Rx for Combipatch to verified pharmacy on file. Patient verbalizes understanding and is agreeable.  Routing to provider for final review. Patient is agreeable to disposition. Will close encounter.

## 2016-08-25 NOTE — Telephone Encounter (Signed)
Patient received a script for a patch about 18 months ago and never filled it.  Patient now wants to fill the script and needs a current one called in.

## 2016-08-25 NOTE — Telephone Encounter (Signed)
Spoke with patient. Patient reports she discussed HRT with Dr. Sabra Heck at last [place] dated 05/26/16. Patient was still undecided at that time as to if she was going to start HRT, was advised to call office when she decided. Patient states she has had some recent emotional changes and new digestive symptoms, would like new RX for HRT, combipatch. Patient would like to know if Dr. Sabra Heck can be specific in side effects given her history that she should be aware of. Advised patient would review with Dr. Sabra Heck and return call with recommendations, patient is agreeable.  Dr. Sabra Heck please advise?

## 2016-08-25 NOTE — Telephone Encounter (Signed)
OK to call in combipatch 50/250.  One patch to skin, changing twice weekly.    She and I have discussed all the HRT risks in the past including DVT/PE, MI, Hypertension, elevated BP, stroke, vaginal bleeding.  Additional risks/side effects that are GI related are: abd pain/cramping 6-16%, dyspepsia, 3-9%, constipation, 4-7%, flatulence 3-7%, nausea 3-7%, diarrhea 3%.  Despite how all of that sounds, I still think it is worth a try and that's why we have discussed it in the past.  If she has a lot of questions, encourage a repeat OV.  Thanks.

## 2016-08-28 NOTE — Telephone Encounter (Signed)
Prescription for CombiPatch faxed to [facility] pharmacy.

## 2016-09-05 DIAGNOSIS — Z1231 Encounter for screening mammogram for malignant neoplasm of breast: Secondary | ICD-10-CM | POA: Diagnosis not present

## 2016-09-06 DIAGNOSIS — S138XXA Sprain of joints and ligaments of other parts of neck, initial encounter: Secondary | ICD-10-CM | POA: Diagnosis not present

## 2016-09-13 DIAGNOSIS — H16223 Keratoconjunctivitis sicca, not specified as Sjogren's, bilateral: Secondary | ICD-10-CM | POA: Diagnosis not present

## 2016-09-13 DIAGNOSIS — H01009 Unspecified blepharitis unspecified eye, unspecified eyelid: Secondary | ICD-10-CM | POA: Diagnosis not present

## 2016-09-29 ENCOUNTER — Encounter: Payer: Self-pay | Admitting: Obstetrics & Gynecology

## 2016-10-03 DIAGNOSIS — M546 Pain in thoracic spine: Secondary | ICD-10-CM | POA: Diagnosis not present

## 2016-10-03 DIAGNOSIS — M542 Cervicalgia: Secondary | ICD-10-CM | POA: Diagnosis not present

## 2016-10-03 DIAGNOSIS — M9902 Segmental and somatic dysfunction of thoracic region: Secondary | ICD-10-CM | POA: Diagnosis not present

## 2016-10-03 DIAGNOSIS — M9901 Segmental and somatic dysfunction of cervical region: Secondary | ICD-10-CM | POA: Diagnosis not present

## 2016-10-04 DIAGNOSIS — M9901 Segmental and somatic dysfunction of cervical region: Secondary | ICD-10-CM | POA: Diagnosis not present

## 2016-10-04 DIAGNOSIS — M9902 Segmental and somatic dysfunction of thoracic region: Secondary | ICD-10-CM | POA: Diagnosis not present

## 2016-10-04 DIAGNOSIS — M542 Cervicalgia: Secondary | ICD-10-CM | POA: Diagnosis not present

## 2016-10-04 DIAGNOSIS — M546 Pain in thoracic spine: Secondary | ICD-10-CM | POA: Diagnosis not present

## 2016-10-05 DIAGNOSIS — M9902 Segmental and somatic dysfunction of thoracic region: Secondary | ICD-10-CM | POA: Diagnosis not present

## 2016-10-05 DIAGNOSIS — M542 Cervicalgia: Secondary | ICD-10-CM | POA: Diagnosis not present

## 2016-10-05 DIAGNOSIS — M9901 Segmental and somatic dysfunction of cervical region: Secondary | ICD-10-CM | POA: Diagnosis not present

## 2016-10-05 DIAGNOSIS — M546 Pain in thoracic spine: Secondary | ICD-10-CM | POA: Diagnosis not present

## 2016-10-16 DIAGNOSIS — M9901 Segmental and somatic dysfunction of cervical region: Secondary | ICD-10-CM | POA: Diagnosis not present

## 2016-10-16 DIAGNOSIS — M9902 Segmental and somatic dysfunction of thoracic region: Secondary | ICD-10-CM | POA: Diagnosis not present

## 2016-10-16 DIAGNOSIS — M542 Cervicalgia: Secondary | ICD-10-CM | POA: Diagnosis not present

## 2016-10-16 DIAGNOSIS — M546 Pain in thoracic spine: Secondary | ICD-10-CM | POA: Diagnosis not present

## 2016-10-18 DIAGNOSIS — M9902 Segmental and somatic dysfunction of thoracic region: Secondary | ICD-10-CM | POA: Diagnosis not present

## 2016-10-18 DIAGNOSIS — M546 Pain in thoracic spine: Secondary | ICD-10-CM | POA: Diagnosis not present

## 2016-10-18 DIAGNOSIS — M9901 Segmental and somatic dysfunction of cervical region: Secondary | ICD-10-CM | POA: Diagnosis not present

## 2016-10-18 DIAGNOSIS — M542 Cervicalgia: Secondary | ICD-10-CM | POA: Diagnosis not present

## 2016-10-19 DIAGNOSIS — M546 Pain in thoracic spine: Secondary | ICD-10-CM | POA: Diagnosis not present

## 2016-10-19 DIAGNOSIS — M9901 Segmental and somatic dysfunction of cervical region: Secondary | ICD-10-CM | POA: Diagnosis not present

## 2016-10-19 DIAGNOSIS — M542 Cervicalgia: Secondary | ICD-10-CM | POA: Diagnosis not present

## 2016-10-19 DIAGNOSIS — M9902 Segmental and somatic dysfunction of thoracic region: Secondary | ICD-10-CM | POA: Diagnosis not present

## 2016-10-23 DIAGNOSIS — M542 Cervicalgia: Secondary | ICD-10-CM | POA: Diagnosis not present

## 2016-10-23 DIAGNOSIS — M9901 Segmental and somatic dysfunction of cervical region: Secondary | ICD-10-CM | POA: Diagnosis not present

## 2016-10-23 DIAGNOSIS — M9902 Segmental and somatic dysfunction of thoracic region: Secondary | ICD-10-CM | POA: Diagnosis not present

## 2016-10-23 DIAGNOSIS — M546 Pain in thoracic spine: Secondary | ICD-10-CM | POA: Diagnosis not present

## 2016-10-25 DIAGNOSIS — M546 Pain in thoracic spine: Secondary | ICD-10-CM | POA: Diagnosis not present

## 2016-10-25 DIAGNOSIS — M9902 Segmental and somatic dysfunction of thoracic region: Secondary | ICD-10-CM | POA: Diagnosis not present

## 2016-10-25 DIAGNOSIS — M542 Cervicalgia: Secondary | ICD-10-CM | POA: Diagnosis not present

## 2016-10-25 DIAGNOSIS — M9901 Segmental and somatic dysfunction of cervical region: Secondary | ICD-10-CM | POA: Diagnosis not present

## 2016-10-30 DIAGNOSIS — M9901 Segmental and somatic dysfunction of cervical region: Secondary | ICD-10-CM | POA: Diagnosis not present

## 2016-10-30 DIAGNOSIS — M9902 Segmental and somatic dysfunction of thoracic region: Secondary | ICD-10-CM | POA: Diagnosis not present

## 2016-10-30 DIAGNOSIS — M542 Cervicalgia: Secondary | ICD-10-CM | POA: Diagnosis not present

## 2016-10-30 DIAGNOSIS — M546 Pain in thoracic spine: Secondary | ICD-10-CM | POA: Diagnosis not present

## 2016-11-01 DIAGNOSIS — M9901 Segmental and somatic dysfunction of cervical region: Secondary | ICD-10-CM | POA: Diagnosis not present

## 2016-11-01 DIAGNOSIS — M542 Cervicalgia: Secondary | ICD-10-CM | POA: Diagnosis not present

## 2016-11-01 DIAGNOSIS — M9902 Segmental and somatic dysfunction of thoracic region: Secondary | ICD-10-CM | POA: Diagnosis not present

## 2016-11-01 DIAGNOSIS — M546 Pain in thoracic spine: Secondary | ICD-10-CM | POA: Diagnosis not present

## 2016-11-02 DIAGNOSIS — M9902 Segmental and somatic dysfunction of thoracic region: Secondary | ICD-10-CM | POA: Diagnosis not present

## 2016-11-02 DIAGNOSIS — M9901 Segmental and somatic dysfunction of cervical region: Secondary | ICD-10-CM | POA: Diagnosis not present

## 2016-11-02 DIAGNOSIS — M546 Pain in thoracic spine: Secondary | ICD-10-CM | POA: Diagnosis not present

## 2016-11-02 DIAGNOSIS — M542 Cervicalgia: Secondary | ICD-10-CM | POA: Diagnosis not present

## 2016-11-08 DIAGNOSIS — K649 Unspecified hemorrhoids: Secondary | ICD-10-CM | POA: Insufficient documentation

## 2016-11-13 DIAGNOSIS — M546 Pain in thoracic spine: Secondary | ICD-10-CM | POA: Diagnosis not present

## 2016-11-13 DIAGNOSIS — M9901 Segmental and somatic dysfunction of cervical region: Secondary | ICD-10-CM | POA: Diagnosis not present

## 2016-11-13 DIAGNOSIS — M542 Cervicalgia: Secondary | ICD-10-CM | POA: Diagnosis not present

## 2016-11-13 DIAGNOSIS — M9902 Segmental and somatic dysfunction of thoracic region: Secondary | ICD-10-CM | POA: Diagnosis not present

## 2016-11-15 DIAGNOSIS — M546 Pain in thoracic spine: Secondary | ICD-10-CM | POA: Diagnosis not present

## 2016-11-15 DIAGNOSIS — M9902 Segmental and somatic dysfunction of thoracic region: Secondary | ICD-10-CM | POA: Diagnosis not present

## 2016-11-15 DIAGNOSIS — M9901 Segmental and somatic dysfunction of cervical region: Secondary | ICD-10-CM | POA: Diagnosis not present

## 2016-11-15 DIAGNOSIS — M542 Cervicalgia: Secondary | ICD-10-CM | POA: Diagnosis not present

## 2016-11-16 DIAGNOSIS — M546 Pain in thoracic spine: Secondary | ICD-10-CM | POA: Diagnosis not present

## 2016-11-16 DIAGNOSIS — M9902 Segmental and somatic dysfunction of thoracic region: Secondary | ICD-10-CM | POA: Diagnosis not present

## 2016-11-16 DIAGNOSIS — M9901 Segmental and somatic dysfunction of cervical region: Secondary | ICD-10-CM | POA: Diagnosis not present

## 2016-11-16 DIAGNOSIS — M542 Cervicalgia: Secondary | ICD-10-CM | POA: Diagnosis not present

## 2016-11-20 DIAGNOSIS — M9901 Segmental and somatic dysfunction of cervical region: Secondary | ICD-10-CM | POA: Diagnosis not present

## 2016-11-20 DIAGNOSIS — M9902 Segmental and somatic dysfunction of thoracic region: Secondary | ICD-10-CM | POA: Diagnosis not present

## 2016-11-20 DIAGNOSIS — M546 Pain in thoracic spine: Secondary | ICD-10-CM | POA: Diagnosis not present

## 2016-11-20 DIAGNOSIS — M542 Cervicalgia: Secondary | ICD-10-CM | POA: Diagnosis not present

## 2016-11-23 DIAGNOSIS — M9901 Segmental and somatic dysfunction of cervical region: Secondary | ICD-10-CM | POA: Diagnosis not present

## 2016-11-23 DIAGNOSIS — M9902 Segmental and somatic dysfunction of thoracic region: Secondary | ICD-10-CM | POA: Diagnosis not present

## 2016-11-23 DIAGNOSIS — M542 Cervicalgia: Secondary | ICD-10-CM | POA: Diagnosis not present

## 2016-11-23 DIAGNOSIS — M546 Pain in thoracic spine: Secondary | ICD-10-CM | POA: Diagnosis not present

## 2016-11-27 DIAGNOSIS — M9902 Segmental and somatic dysfunction of thoracic region: Secondary | ICD-10-CM | POA: Diagnosis not present

## 2016-11-27 DIAGNOSIS — M542 Cervicalgia: Secondary | ICD-10-CM | POA: Diagnosis not present

## 2016-11-27 DIAGNOSIS — M546 Pain in thoracic spine: Secondary | ICD-10-CM | POA: Diagnosis not present

## 2016-11-27 DIAGNOSIS — M9901 Segmental and somatic dysfunction of cervical region: Secondary | ICD-10-CM | POA: Diagnosis not present

## 2016-11-29 DIAGNOSIS — H16223 Keratoconjunctivitis sicca, not specified as Sjogren's, bilateral: Secondary | ICD-10-CM | POA: Diagnosis not present

## 2016-11-29 DIAGNOSIS — H01009 Unspecified blepharitis unspecified eye, unspecified eyelid: Secondary | ICD-10-CM | POA: Diagnosis not present

## 2016-11-30 DIAGNOSIS — M542 Cervicalgia: Secondary | ICD-10-CM | POA: Diagnosis not present

## 2016-11-30 DIAGNOSIS — M9901 Segmental and somatic dysfunction of cervical region: Secondary | ICD-10-CM | POA: Diagnosis not present

## 2016-11-30 DIAGNOSIS — M546 Pain in thoracic spine: Secondary | ICD-10-CM | POA: Diagnosis not present

## 2016-11-30 DIAGNOSIS — M9902 Segmental and somatic dysfunction of thoracic region: Secondary | ICD-10-CM | POA: Diagnosis not present

## 2016-12-04 DIAGNOSIS — M542 Cervicalgia: Secondary | ICD-10-CM | POA: Diagnosis not present

## 2016-12-04 DIAGNOSIS — M9902 Segmental and somatic dysfunction of thoracic region: Secondary | ICD-10-CM | POA: Diagnosis not present

## 2016-12-04 DIAGNOSIS — M9901 Segmental and somatic dysfunction of cervical region: Secondary | ICD-10-CM | POA: Diagnosis not present

## 2016-12-04 DIAGNOSIS — M546 Pain in thoracic spine: Secondary | ICD-10-CM | POA: Diagnosis not present

## 2016-12-07 DIAGNOSIS — M9901 Segmental and somatic dysfunction of cervical region: Secondary | ICD-10-CM | POA: Diagnosis not present

## 2016-12-07 DIAGNOSIS — M9902 Segmental and somatic dysfunction of thoracic region: Secondary | ICD-10-CM | POA: Diagnosis not present

## 2016-12-07 DIAGNOSIS — M546 Pain in thoracic spine: Secondary | ICD-10-CM | POA: Diagnosis not present

## 2016-12-07 DIAGNOSIS — M542 Cervicalgia: Secondary | ICD-10-CM | POA: Diagnosis not present

## 2016-12-14 DIAGNOSIS — M542 Cervicalgia: Secondary | ICD-10-CM | POA: Diagnosis not present

## 2016-12-14 DIAGNOSIS — M9901 Segmental and somatic dysfunction of cervical region: Secondary | ICD-10-CM | POA: Diagnosis not present

## 2016-12-14 DIAGNOSIS — M546 Pain in thoracic spine: Secondary | ICD-10-CM | POA: Diagnosis not present

## 2016-12-14 DIAGNOSIS — M9902 Segmental and somatic dysfunction of thoracic region: Secondary | ICD-10-CM | POA: Diagnosis not present

## 2016-12-20 DIAGNOSIS — M542 Cervicalgia: Secondary | ICD-10-CM | POA: Diagnosis not present

## 2016-12-20 DIAGNOSIS — M546 Pain in thoracic spine: Secondary | ICD-10-CM | POA: Diagnosis not present

## 2016-12-20 DIAGNOSIS — M9901 Segmental and somatic dysfunction of cervical region: Secondary | ICD-10-CM | POA: Diagnosis not present

## 2016-12-20 DIAGNOSIS — M9902 Segmental and somatic dysfunction of thoracic region: Secondary | ICD-10-CM | POA: Diagnosis not present

## 2017-01-03 DIAGNOSIS — M25571 Pain in right ankle and joints of right foot: Secondary | ICD-10-CM | POA: Diagnosis not present

## 2017-01-10 DIAGNOSIS — M9902 Segmental and somatic dysfunction of thoracic region: Secondary | ICD-10-CM | POA: Diagnosis not present

## 2017-01-10 DIAGNOSIS — M9901 Segmental and somatic dysfunction of cervical region: Secondary | ICD-10-CM | POA: Diagnosis not present

## 2017-01-10 DIAGNOSIS — M546 Pain in thoracic spine: Secondary | ICD-10-CM | POA: Diagnosis not present

## 2017-01-10 DIAGNOSIS — M542 Cervicalgia: Secondary | ICD-10-CM | POA: Diagnosis not present

## 2017-01-15 DIAGNOSIS — H01009 Unspecified blepharitis unspecified eye, unspecified eyelid: Secondary | ICD-10-CM | POA: Diagnosis not present

## 2017-01-15 DIAGNOSIS — H16223 Keratoconjunctivitis sicca, not specified as Sjogren's, bilateral: Secondary | ICD-10-CM | POA: Diagnosis not present

## 2017-01-31 DIAGNOSIS — M542 Cervicalgia: Secondary | ICD-10-CM | POA: Diagnosis not present

## 2017-01-31 DIAGNOSIS — M546 Pain in thoracic spine: Secondary | ICD-10-CM | POA: Diagnosis not present

## 2017-01-31 DIAGNOSIS — M9902 Segmental and somatic dysfunction of thoracic region: Secondary | ICD-10-CM | POA: Diagnosis not present

## 2017-01-31 DIAGNOSIS — M9901 Segmental and somatic dysfunction of cervical region: Secondary | ICD-10-CM | POA: Diagnosis not present

## 2017-02-23 ENCOUNTER — Ambulatory Visit: Payer: BLUE CROSS/BLUE SHIELD | Admitting: Obstetrics & Gynecology

## 2017-03-05 ENCOUNTER — Telehealth: Payer: Self-pay | Admitting: Internal Medicine

## 2017-03-05 NOTE — Telephone Encounter (Signed)
Patient wanting to set up a banding.  Ok to set up directly in a banding spot or OV.  You discussed hemorrhoids, but not sure if they are internal or if she is a banding candidate

## 2017-03-05 NOTE — Telephone Encounter (Signed)
[place] for next available office followup or hemorrhoidal banding slot to discuss hemorrhoids and possible band ligation

## 2017-03-06 DIAGNOSIS — M546 Pain in thoracic spine: Secondary | ICD-10-CM | POA: Diagnosis not present

## 2017-03-06 DIAGNOSIS — M542 Cervicalgia: Secondary | ICD-10-CM | POA: Diagnosis not present

## 2017-03-06 DIAGNOSIS — M9901 Segmental and somatic dysfunction of cervical region: Secondary | ICD-10-CM | POA: Diagnosis not present

## 2017-03-06 DIAGNOSIS — M9902 Segmental and somatic dysfunction of thoracic region: Secondary | ICD-10-CM | POA: Diagnosis not present

## 2017-03-06 NOTE — Telephone Encounter (Signed)
I see where the pt has been scheduled for an appt 04/27/17@3pm , has she been notified of the appt?

## 2017-03-06 NOTE — Telephone Encounter (Signed)
Patient is aware of the appt details.  I scheduled it with her.

## 2017-03-07 DIAGNOSIS — H01009 Unspecified blepharitis unspecified eye, unspecified eyelid: Secondary | ICD-10-CM | POA: Diagnosis not present

## 2017-03-07 DIAGNOSIS — H16223 Keratoconjunctivitis sicca, not specified as Sjogren's, bilateral: Secondary | ICD-10-CM | POA: Diagnosis not present

## 2017-03-29 ENCOUNTER — Telehealth: Payer: Self-pay | Admitting: Internal Medicine

## 2017-03-29 NOTE — Telephone Encounter (Signed)
I have spoken to patient and have answered questions about hemorrhoidal banding. She has asked to reschedule to a different day. Appointment rescheduled.

## 2017-04-18 DIAGNOSIS — H16223 Keratoconjunctivitis sicca, not specified as Sjogren's, bilateral: Secondary | ICD-10-CM | POA: Diagnosis not present

## 2017-04-18 DIAGNOSIS — H01009 Unspecified blepharitis unspecified eye, unspecified eyelid: Secondary | ICD-10-CM | POA: Diagnosis not present

## 2017-04-23 ENCOUNTER — Ambulatory Visit (INDEPENDENT_AMBULATORY_CARE_PROVIDER_SITE_OTHER): Payer: BLUE CROSS/BLUE SHIELD | Admitting: Obstetrics & Gynecology

## 2017-04-23 ENCOUNTER — Other Ambulatory Visit: Payer: Self-pay

## 2017-04-23 ENCOUNTER — Encounter: Payer: Self-pay | Admitting: Obstetrics & Gynecology

## 2017-04-23 ENCOUNTER — Other Ambulatory Visit (HOSPITAL_COMMUNITY)
Admission: RE | Admit: 2017-04-23 | Discharge: 2017-04-23 | Disposition: A | Discharge status: Home / Self Care | Payer: BLUE CROSS/BLUE SHIELD | Source: Ambulatory Visit | Attending: Obstetrics & Gynecology | Admitting: Obstetrics & Gynecology

## 2017-04-23 VITALS — BP 100/70 | HR 62 | Resp 16 | Ht 63.5 in | Wt 98.6 lb

## 2017-04-23 DIAGNOSIS — Z124 Encounter for screening for malignant neoplasm of cervix: Secondary | ICD-10-CM

## 2017-04-23 DIAGNOSIS — Z01419 Encounter for gynecological examination (general) (routine) without abnormal findings: Secondary | ICD-10-CM | POA: Diagnosis not present

## 2017-04-23 DIAGNOSIS — Z0000 Encounter for general adult medical examination without abnormal findings: Secondary | ICD-10-CM | POA: Diagnosis not present

## 2017-04-23 NOTE — Progress Notes (Signed)
57 y.o. G0P0000 MarriedCaucasianF here for annual exam.  Doing well except continues to have GI issues.  In April or May she discovered that much of this was related to stressors.  She is working on stress relief and this is better.  Denies no vaginal bleeding.   Does having some rectal bleeding.  Having internal hemorrhoid banding with Dr. Hilarie Fredrickson in March.    Patient's last menstrual period was 11/04/2015.          Sexually active: No.  The current method of family planning is abstinence and post menopausal status.    Exercising: Yes.    walking, elliptical  Smoker:  no  Health Maintenance: Pap:  12/16/14 Neg. HR HPV:neg  History of abnormal Pap: No MMG:  09/05/16 BIRADS1:Neg  Colonoscopy:  06/22/11 Normal. F/u 10 years  BMD:  Hand scan - Normal. TDaP:  2016  Pneumonia vaccine(s):  No Shingrix: No Hep C testing: Done  Screening Labs: Here today    reports that  has never smoked. she has never used smokeless tobacco. She reports that she drinks about 1.2 oz of alcohol per week. She reports that she does not use drugs.  Past Medical History:  Diagnosis Date  . Cyst of left kidney 07/24/2014   simple appearing left mid kidney cyst  . GERD (gastroesophageal reflux disease) 09/2011  . Hiatal hernia   . Pericardial effusion 07/24/2014   trace anterior  . Uterine fibroid 07/24/2014   2.4 cm subserosal left fundal    Past Surgical History:  Procedure Laterality Date  . HYSTEROSCOPY  03/30/2008   resction of polyps and D&C  . TONSILLECTOMY AND ADENOIDECTOMY  age 89  . WISDOM TOOTH EXTRACTION  age 50    Current Outpatient Medications  Medication Sig Dispense Refill  . Eyelid Cleansers (AVENOVA) 0.01 % SOLN Apply topically 2 (two) times daily.    . Probiotic Product (PROBIOTIC-10) CAPS Take by mouth.     No current facility-administered medications for this visit.     Family History  Problem Relation Age of Onset  . Atrial fibrillation Father   . Diabetes Father   . Heart  disease Brother   . Colon cancer Maternal Grandmother 60       lung cancer  . Cancer Maternal Grandfather 27       brain cancer  . Rheum arthritis Paternal Grandmother     ROS:  Pertinent items are noted in HPI.  Otherwise, a comprehensive ROS was negative.  Exam:   BP 100/70 (BP Location: Right Arm, Patient Position: Sitting, Cuff Size: Normal)   Pulse 62   Resp 16   Ht 5' 3.5" (1.613 m)   Wt 98 lb 9.6 oz (44.7 kg)   LMP 11/04/2015   BMI 17.19 kg/m    Height: 5' 3.5" (161.3 cm)  Ht Readings from Last 3 Encounters:  04/23/17 5' 3.5" (1.613 m)  05/26/16 5' 3.75" (1.619 m)  01/19/16 5\' 3"  (1.6 m)    General appearance: alert, cooperative and appears stated age Head: Normocephalic, without obvious abnormality, atraumatic Neck: no adenopathy, supple, symmetrical, trachea midline and thyroid normal to inspection and palpation Lungs: clear to auscultation bilaterally Breasts: normal appearance, no masses or tenderness Heart: regular rate and rhythm Abdomen: soft, non-tender; bowel sounds normal; no masses,  no organomegaly Extremities: extremities normal, atraumatic, no cyanosis or edema Skin: Skin color, texture, turgor normal. No rashes or lesions Lymph nodes: Cervical, supraclavicular, and axillary nodes normal. No abnormal inguinal nodes palpated Neurologic: Grossly normal  Pelvic: External genitalia:  no lesions              Urethra:  normal appearing urethra with no masses, tenderness or lesions              Bartholins and Skenes: normal                 Vagina: normal appearing vagina with normal color and discharge, no lesions              Cervix: no lesions              Pap taken: Yes.   Bimanual Exam:  Uterus:  normal size, contour, position, consistency, mobility, non-tender              Adnexa: normal adnexa and no mass, fullness, tenderness               Rectovaginal: Confirms               Anus:  normal sphincter tone, no lesions  Chaperone was present for  exam.  A:  Well Woman with normal exam PMP, no HRT bleedign hemorrhoids Stressors causing GI issues Weight concerns  P:   Mammogram guidelines reviewed.  Doing yearly. pap smear obtained today CBC, CMP, lipids, TSH, Vit D will be obtained today Hemorrhoid banding is planned for March return annually or prn

## 2017-04-24 ENCOUNTER — Other Ambulatory Visit (INDEPENDENT_AMBULATORY_CARE_PROVIDER_SITE_OTHER): Payer: BLUE CROSS/BLUE SHIELD

## 2017-04-24 DIAGNOSIS — Z0000 Encounter for general adult medical examination without abnormal findings: Secondary | ICD-10-CM | POA: Diagnosis not present

## 2017-04-24 LAB — CYTOLOGY - PAP: DIAGNOSIS: NEGATIVE

## 2017-04-25 ENCOUNTER — Other Ambulatory Visit: Payer: BLUE CROSS/BLUE SHIELD

## 2017-04-25 LAB — LIPID PANEL
Chol/HDL Ratio: 2.5 ratio (ref 0.0–4.4)
Cholesterol, Total: 227 mg/dL — ABNORMAL HIGH (ref 100–199)
HDL: 92 mg/dL (ref >39)
LDL Calculated: 125 mg/dL — ABNORMAL HIGH (ref 0–99)
Triglycerides: 51 mg/dL (ref 0–149)
VLDL CHOLESTEROL CAL: 10 mg/dL (ref 5–40)

## 2017-04-25 LAB — COMPREHENSIVE METABOLIC PANEL
A/G RATIO: 2.1 (ref 1.2–2.2)
ALBUMIN: 4.6 g/dL (ref 3.5–5.5)
ALK PHOS: 74 IU/L (ref 39–117)
ALT: 22 IU/L (ref 0–32)
AST: 27 IU/L (ref 0–40)
BUN / CREAT RATIO: 34 — AB (ref 9–23)
BUN: 22 mg/dL (ref 6–24)
Bilirubin Total: 0.3 mg/dL (ref 0.0–1.2)
CHLORIDE: 101 mmol/L (ref 96–106)
CO2: 23 mmol/L (ref 20–29)
Calcium: 9.8 mg/dL (ref 8.7–10.2)
Creatinine, Ser: 0.64 mg/dL (ref 0.57–1.00)
GFR calc non Af Amer: 100 mL/min/{1.73_m2} (ref >59)
GFR, EST AFRICAN AMERICAN: 115 mL/min/{1.73_m2} (ref >59)
GLOBULIN, TOTAL: 2.2 g/dL (ref 1.5–4.5)
GLUCOSE: 87 mg/dL (ref 65–99)
Potassium: 4.3 mmol/L (ref 3.5–5.2)
SODIUM: 142 mmol/L (ref 134–144)
Total Protein: 6.8 g/dL (ref 6.0–8.5)

## 2017-04-25 LAB — CBC
HEMATOCRIT: 38.9 % (ref 34.0–46.6)
Hemoglobin: 13 g/dL (ref 11.1–15.9)
MCH: 29.7 pg (ref 26.6–33.0)
MCHC: 33.4 g/dL (ref 31.5–35.7)
MCV: 89 fL (ref 79–97)
Platelets: 234 10*3/uL (ref 150–379)
RBC: 4.37 x10E6/uL (ref 3.77–5.28)
RDW: 14 % (ref 12.3–15.4)
WBC: 6.2 10*3/uL (ref 3.4–10.8)

## 2017-04-25 LAB — VITAMIN D 25 HYDROXY (VIT D DEFICIENCY, FRACTURES): VIT D 25 HYDROXY: 44.8 ng/mL (ref 30.0–100.0)

## 2017-04-25 LAB — TSH: TSH: 1.19 u[IU]/mL (ref 0.450–4.500)

## 2017-04-26 ENCOUNTER — Other Ambulatory Visit: Payer: BLUE CROSS/BLUE SHIELD

## 2017-04-27 ENCOUNTER — Ambulatory Visit: Payer: BLUE CROSS/BLUE SHIELD | Admitting: Internal Medicine

## 2017-05-28 ENCOUNTER — Ambulatory Visit: Payer: BLUE CROSS/BLUE SHIELD | Admitting: Internal Medicine

## 2017-06-15 DIAGNOSIS — H01009 Unspecified blepharitis unspecified eye, unspecified eyelid: Secondary | ICD-10-CM | POA: Diagnosis not present

## 2017-06-15 DIAGNOSIS — H16223 Keratoconjunctivitis sicca, not specified as Sjogren's, bilateral: Secondary | ICD-10-CM | POA: Diagnosis not present

## 2017-06-29 ENCOUNTER — Ambulatory Visit (INDEPENDENT_AMBULATORY_CARE_PROVIDER_SITE_OTHER): Payer: BLUE CROSS/BLUE SHIELD | Admitting: Internal Medicine

## 2017-06-29 ENCOUNTER — Encounter: Payer: Self-pay | Admitting: Internal Medicine

## 2017-06-29 VITALS — BP 106/62 | HR 74 | Ht 63.5 in | Wt 99.2 lb

## 2017-06-29 DIAGNOSIS — K5900 Constipation, unspecified: Secondary | ICD-10-CM | POA: Diagnosis not present

## 2017-06-29 DIAGNOSIS — K30 Functional dyspepsia: Secondary | ICD-10-CM

## 2017-06-29 DIAGNOSIS — R151 Fecal smearing: Secondary | ICD-10-CM | POA: Diagnosis not present

## 2017-06-29 DIAGNOSIS — K648 Other hemorrhoids: Secondary | ICD-10-CM | POA: Diagnosis not present

## 2017-06-29 NOTE — Patient Instructions (Addendum)
Normal BMI (Body Mass Index- based on height and weight) is between 19 and 25. Your BMI today is Body mass index is 17.31 kg/m. Marland Kitchen Please consider follow up  regarding your BMI with your Primary Care Provider.  HEMORRHOID BANDING PROCEDURE    FOLLOW-UP CARE   1. The procedure you have had should have been relatively painless since the banding of the area involved does not have nerve endings and there is no pain sensation.  The rubber band cuts off the blood supply to the hemorrhoid and the band may fall off as soon as 48 hours after the banding (the band may occasionally be seen in the toilet bowl following a bowel movement). You may notice a temporary feeling of fullness in the rectum which should respond adequately to plain Tylenol or Motrin.  2. Following the banding, avoid strenuous exercise that evening and resume full activity the next day.  A sitz bath (soaking in a warm tub) or bidet is soothing, and can be useful for cleansing the area after bowel movements.     3. To avoid constipation, take two tablespoons of natural wheat bran, natural oat bran, flax, Benefiber or any over the counter fiber supplement and increase your water intake to 7-8 glasses daily.    4. Unless you have been prescribed anorectal medication, do not put anything inside your rectum for two weeks: No suppositories, enemas, fingers, etc.  5. Occasionally, you may have more bleeding than usual after the banding procedure.  This is often from the untreated hemorrhoids rather than the treated one.  Don't be concerned if there is a tablespoon or so of blood.  If there is more blood than this, lie flat with your bottom higher than your head and apply an ice pack to the area. If the bleeding does not stop within a half an hour or if you feel faint, call our office at [phone] or go to the emergency room.  6. Problems are not common; however, if there is a substantial amount of bleeding, severe pain, chills, fever or  difficulty passing urine (very rare) or other problems, you should call us at [phone] or report to the nearest emergency room.  7. Do not stay seated continuously for more than 2-3 hours for a day or two after the procedure.  Tighten your buttock muscles 10-15 times every two hours and take 10-15 deep breaths every 1-2 hours.  Do not spend more than a few minutes on the toilet if you cannot empty your bowel; instead re-visit the toilet at a later time.  You have been scheduled for your 2nd and 3rd banding on 08/14/17 at 3;45pm, and 09/04/17

## 2017-06-29 NOTE — Progress Notes (Signed)
Subjective:    Patient ID: Laurie Griffith, female    DOB: 09/07/1960, 57 y.o.   MRN: [id number]  HPI Laurie Griffith is a 57 year old female with a history of GERD, chronic epigastric pain felt secondary to IBS and functional dyspepsia, history of symptom medic hemorrhoids who is here for follow-up and consideration of hemorrhoidal banding.  She is here alone today.  She was last seen in October 2017.  She reports that her upper abdominal pain has improved.  She was seen by a gastro-psychologist and this was helpful for her.  She has figured out certain triggers for her upper abdominal pain and overall this has improved.  She reports that she has a long history of internal hemorrhoids with bleeding.  She reports seeing bright red blood per rectum mostly painless in nature with nearly every bowel movement.  On one occasion in the last couple of months she saw blood in the toilet water.  She has some mild perianal discomfort and prolapse symptoms.  She also reports some mild fecal seepage and smearing.  Not much itching.  Symptoms worse with prolonged sitting.  Stools have been at times hard or more firm and she reports she often strains for bowel movement and does spend a long period of time on the toilet to have her bowel movement.  This is long-standing.  Occasionally she will have a bowel movement twice daily but usually has a bowel movement at least once daily.  She does admit to not drinking enough water. She has tried stool softeners in the past with some success.  She had a colonoscopy in 2013 which was normal.  Review of Systems As per HPI, otherwise negative  Current Medications, Allergies, Past Medical History, Past Surgical History, Family History and Social History were reviewed in [employer] record.     Objective:   Physical Exam BP 106/62   Pulse 74   Ht 5' 3.5" (1.613 m)   Wt 99 lb 4 oz (45 kg)   LMP 11/05/2015 (Approximate)   BMI 17.31 kg/m    Constitutional: Well-developed and well-nourished. No distress. HEENT: Normocephalic and atraumatic.  Abdominal: Soft, nontender, nondistended. Bowel sounds active throughout.  Rectal: mild perianal prolapse without external hemorrhoid or tenderness, no rash ANOSCOPY: Using a disposable, lubricated, slotted, self-illuminating anoscope, the rectum was intubated without difficulty. The trochar was removed and the ano-rectum was circumferentially inspected. There were internal hemorrhoids, LL> RA, minimal at RP.  There was no finding of an anorectal fissure. The rectal mucosa was not inflamed. No neoplasia or other pathology was identified. The inspection was well tolerated.  Extremities: no clubbing, cyanosis, or edema Neurological: Alert and oriented to person place and time. Skin: Skin is warm and dry. Psychiatric: Normal mood and affect. Behavior is normal.      Assessment & Plan:  57 year old female with a history of GERD, chronic epigastric pain felt secondary to IBS and functional dyspepsia, history of symptom medic hemorrhoids who is here for follow-up and consideration of hemorrhoidal banding.  1.  Symptomatic internal hemorrhoids with bleeding, prolapse and fecal seepage --we discussed hemorrhoidal banding today, including the risk, benefits and alternatives and she wishes to proceed.  2.  Constipation --mild but stools are often hard and require straining.  This exacerbates #1.  I recommended increased water intake but also the addition of Benefiber 1 tablespoon daily.  If ineffective may need Colace 100-200 mg nightly.  3.  Functional dyspepsia --currently working on this issue by  avoiding trigger foods and also a better understanding of psychological triggers.  PROCEDURE NOTE:  The patient presents with symptomatic grade 2-3 internal hemorrhoids, requesting rubber band ligation of her hemorrhoidal disease.  All risks, benefits and alternative forms of therapy were described and  informed consent was obtained.   The anorectum was pre-medicated with 0.125% nitroglycerin ointment The decision was made to band the LL internal hemorrhoid, and the [place] was used to perform band ligation without complication.   Digital anorectal examination was then performed to assure proper positioning of the band, and to adjust the banded tissue as required.  The patient was discharged home without pain or other issues.  Dietary and behavioral recommendations were given and along with follow-up instructions.     The patient will return as scheduled for follow-up and possible additional banding as required. No complications were encountered and the patient tolerated the procedure well.

## 2017-07-14 DIAGNOSIS — H9313 Tinnitus, bilateral: Secondary | ICD-10-CM | POA: Diagnosis not present

## 2017-07-14 DIAGNOSIS — H68109 Unspecified obstruction of Eustachian tube, unspecified ear: Secondary | ICD-10-CM | POA: Diagnosis not present

## 2017-07-24 ENCOUNTER — Encounter: Payer: Self-pay | Admitting: *Deleted

## 2017-08-08 DIAGNOSIS — H9313 Tinnitus, bilateral: Secondary | ICD-10-CM | POA: Diagnosis not present

## 2017-08-08 DIAGNOSIS — H903 Sensorineural hearing loss, bilateral: Secondary | ICD-10-CM | POA: Diagnosis not present

## 2017-08-14 ENCOUNTER — Ambulatory Visit (INDEPENDENT_AMBULATORY_CARE_PROVIDER_SITE_OTHER): Payer: BLUE CROSS/BLUE SHIELD | Admitting: Internal Medicine

## 2017-08-14 ENCOUNTER — Encounter: Payer: Self-pay | Admitting: Internal Medicine

## 2017-08-14 VITALS — BP 120/76 | HR 60 | Ht 63.5 in | Wt 101.8 lb

## 2017-08-14 DIAGNOSIS — K648 Other hemorrhoids: Secondary | ICD-10-CM | POA: Diagnosis not present

## 2017-08-14 NOTE — Progress Notes (Signed)
Laurie Griffith is a 57 year old female with history of symptomatic internal hemorrhoids here for consideration of ongoing/repeat hemorrhoidal banding She had internal hemorrhoidal banding performed on 06/29/2017 to the left lateral internal hemorrhoid for symptoms of occasional hemorrhoidal bleeding, hemorrhoidal prolapse and fecal seepage. She reports having tolerated the banding well and she has improvement in her hemorrhoidal symptoms though she is still seeing occasional scant blood.  She feels like the external prolapsed tissue is also smaller to this point.   PROCEDURE NOTE:  The patient presents with symptomatic grade 2-3 internal hemorrhoids, requesting rubber band ligation of her hemorrhoidal disease.  All risks, benefits and alternative forms of therapy were described and informed consent was obtained.   The anorectum was pre-medicated with 0.125% nitroglycerin ointment The decision was made to band the RA (LL on 06/29/2017) internal hemorrhoid, and the [place] was used to perform band ligation without complication.   Digital anorectal examination was then performed to assure proper positioning of the band, and to adjust the banded tissue as required.  The patient was discharged home without pain or other issues.  Dietary and behavioral recommendations were given and along with follow-up instructions.     The following adjunctive treatments were recommended: Continue the Benefiber which she has found to be helpful with her bowel habits  The patient will return as scheduled for follow-up and possible additional banding as required. No complications were encountered and the patient tolerated the procedure well.

## 2017-08-14 NOTE — Patient Instructions (Signed)
You have been scheduled for your 3rd hemorrhoidal banding on 09/17/17 at 330 pm.   HEMORRHOID BANDING PROCEDURE    FOLLOW-UP CARE   1. The procedure you have had should have been relatively painless since the banding of the area involved does not have nerve endings and there is no pain sensation.  The rubber band cuts off the blood supply to the hemorrhoid and the band may fall off as soon as 48 hours after the banding (the band may occasionally be seen in the toilet bowl following a bowel movement). You may notice a temporary feeling of fullness in the rectum which should respond adequately to plain Tylenol or Motrin.  2. Following the banding, avoid strenuous exercise that evening and resume full activity the next day.  A sitz bath (soaking in a warm tub) or bidet is soothing, and can be useful for cleansing the area after bowel movements.     3. To avoid constipation, take two tablespoons of natural wheat bran, natural oat bran, flax, Benefiber or any over the counter fiber supplement and increase your water intake to 7-8 glasses daily.    4. Unless you have been prescribed anorectal medication, do not put anything inside your rectum for two weeks: No suppositories, enemas, fingers, etc.  5. Occasionally, you may have more bleeding than usual after the banding procedure.  This is often from the untreated hemorrhoids rather than the treated one.  Don't be concerned if there is a tablespoon or so of blood.  If there is more blood than this, lie flat with your bottom higher than your head and apply an ice pack to the area. If the bleeding does not stop within a half an hour or if you feel faint, call our office at [phone] or go to the emergency room.  6. Problems are not common; however, if there is a substantial amount of bleeding, severe pain, chills, fever or difficulty passing urine (very rare) or other problems, you should call us at [phone] or report to the nearest emergency  room.  7. Do not stay seated continuously for more than 2-3 hours for a day or two after the procedure.  Tighten your buttock muscles 10-15 times every two hours and take 10-15 deep breaths every 1-2 hours.  Do not spend more than a few minutes on the toilet if you cannot empty your bowel; instead re-visit the toilet at a later time.

## 2017-08-21 ENCOUNTER — Telehealth: Payer: Self-pay | Admitting: Internal Medicine

## 2017-08-21 NOTE — Telephone Encounter (Signed)
Patient states after banding on 5.7.19 she has still had the same amount of bleeding, and pt wants to know if this is normal.

## 2017-08-21 NOTE — Telephone Encounter (Signed)
See note below and advise. 

## 2017-08-23 NOTE — Telephone Encounter (Signed)
Pt aware.

## 2017-08-23 NOTE — Telephone Encounter (Signed)
This is possible until banding protocol has been completed, I.e. 3 banding sessions There is a small risk of bleeding after band placement, from the treatment itself. If persistent bleeding she should let me know and she should be seen

## 2017-09-04 ENCOUNTER — Encounter: Payer: BLUE CROSS/BLUE SHIELD | Admitting: Internal Medicine

## 2017-09-07 DIAGNOSIS — Z1231 Encounter for screening mammogram for malignant neoplasm of breast: Secondary | ICD-10-CM | POA: Diagnosis not present

## 2017-09-17 ENCOUNTER — Encounter: Payer: BLUE CROSS/BLUE SHIELD | Admitting: Internal Medicine

## 2017-09-18 ENCOUNTER — Encounter: Payer: Self-pay | Admitting: Obstetrics & Gynecology

## 2017-11-08 ENCOUNTER — Encounter: Payer: Self-pay | Admitting: Internal Medicine

## 2017-11-08 ENCOUNTER — Ambulatory Visit (INDEPENDENT_AMBULATORY_CARE_PROVIDER_SITE_OTHER): Payer: BLUE CROSS/BLUE SHIELD | Admitting: Internal Medicine

## 2017-11-08 VITALS — BP 110/70 | HR 72 | Ht 63.5 in | Wt 99.0 lb

## 2017-11-08 DIAGNOSIS — K648 Other hemorrhoids: Secondary | ICD-10-CM

## 2017-11-08 DIAGNOSIS — K641 Second degree hemorrhoids: Secondary | ICD-10-CM

## 2017-11-08 DIAGNOSIS — R151 Fecal smearing: Secondary | ICD-10-CM | POA: Diagnosis not present

## 2017-11-08 DIAGNOSIS — K642 Third degree hemorrhoids: Secondary | ICD-10-CM

## 2017-11-08 NOTE — Progress Notes (Signed)
Laurie Griffith is a 57 year old female with history of symptomatic internal hemorrhoids here for consideration of ongoing/repeat hemorrhoidal banding. She had hemorrhoidal banding on 06/29/2017 and 08/14/2017 to the left lateral and right anterior hemorrhoid respectively. Symptoms prior to treatment included occasional hemorrhoidal bleeding, hemorrhoidal prolapse and fecal seepage. Initial banding went well and symptoms had improved though she has not noticed the same incremental improvement after this most recent banding She is still having intermittent scant bleeding, mucus in stool and with wiping and some fecal seepage She is using Benefiber 1-2 times a day, usually twice daily.  This is helped with complete evacuation though she still does have to strain some with initial bowel movement in the morning  PROCEDURE NOTE: The patient presents with symptomatic grade 2-3 internal hemorrhoids, requesting rubber band ligation of her hemorrhoidal disease.  All risks, benefits and alternative forms of therapy were described and informed consent was obtained.   The anorectum was pre-medicated with 0.25% nitroglycerin ointment Anoscopy was performed with the patient in the left lateral decubitus position while a chaperone was present and revealed small hemorrhoid right posterior, no other pathology noted.  Externally on rectal exam there is some mild prolapsed tissue circumferentially and small external hemorrhoids. The decision was made to band the RA internal hemorrhoid, and the [place] was used to perform band ligation without complication.  Digital anorectal examination was then performed to assure proper positioning of the band, and to adjust the banded tissue as required. The patient was discharged home without pain or other issues.  Dietary and behavioral recommendations were given and along with follow-up instructions.     The following adjunctive treatments were recommended: Continue  Benefiber twice daily  If symptoms persist I would recommend we consider anorectal manometry to evaluate for dyssynergic defecation/pelvic floor dysfunction..  We discussed how if this issue were found in pelvic floor physical therapy may improve overall defecate Tory symptoms.  Finally if hemorrhoidal symptoms persist then surgery could also be an option.  No complications were encountered and the patient tolerated the procedure well.

## 2017-11-08 NOTE — Patient Instructions (Signed)

## 2017-11-19 DIAGNOSIS — M21962 Unspecified acquired deformity of left lower leg: Secondary | ICD-10-CM | POA: Diagnosis not present

## 2017-11-19 DIAGNOSIS — M21961 Unspecified acquired deformity of right lower leg: Secondary | ICD-10-CM | POA: Diagnosis not present

## 2017-11-19 DIAGNOSIS — M205X2 Other deformities of toe(s) (acquired), left foot: Secondary | ICD-10-CM | POA: Diagnosis not present

## 2017-11-19 DIAGNOSIS — M79672 Pain in left foot: Secondary | ICD-10-CM | POA: Diagnosis not present

## 2017-11-19 DIAGNOSIS — M79671 Pain in right foot: Secondary | ICD-10-CM | POA: Diagnosis not present

## 2017-11-22 ENCOUNTER — Ambulatory Visit: Payer: BLUE CROSS/BLUE SHIELD | Admitting: Podiatry

## 2017-11-22 DIAGNOSIS — R351 Nocturia: Secondary | ICD-10-CM | POA: Diagnosis not present

## 2017-11-22 DIAGNOSIS — N3944 Nocturnal enuresis: Secondary | ICD-10-CM | POA: Diagnosis not present

## 2017-11-22 DIAGNOSIS — N3281 Overactive bladder: Secondary | ICD-10-CM | POA: Diagnosis not present

## 2017-11-23 ENCOUNTER — Telehealth: Payer: Self-pay | Admitting: Internal Medicine

## 2017-11-23 NOTE — Telephone Encounter (Signed)
Spoke with pt and she is aware of Dr. Pyrtle's recommendation. 

## 2017-11-23 NOTE — Telephone Encounter (Signed)
Pt states she had her 3rd banding done and had cramping in the left front of her pelvis. This subsided but last Tuesday she started having pelvic pain again and she called her urologist. The urologist stated that she may have overactive bladder and wanted to try her on myrbetriq and if that did not work pt for pelvic floor exercises. Pt wanted to get Dr. Vena Rua thoughts on this. Please advise.

## 2017-11-23 NOTE — Telephone Encounter (Signed)
I think Myrbetriq would be very reasonable to try Pelvic floor PT remains an option and this would be with Earlie Counts Thanks

## 2017-12-05 DIAGNOSIS — M79672 Pain in left foot: Secondary | ICD-10-CM | POA: Diagnosis not present

## 2017-12-11 DIAGNOSIS — M21962 Unspecified acquired deformity of left lower leg: Secondary | ICD-10-CM | POA: Diagnosis not present

## 2017-12-11 DIAGNOSIS — M7752 Other enthesopathy of left foot: Secondary | ICD-10-CM | POA: Diagnosis not present

## 2017-12-11 DIAGNOSIS — M205X2 Other deformities of toe(s) (acquired), left foot: Secondary | ICD-10-CM | POA: Diagnosis not present

## 2017-12-11 DIAGNOSIS — M21961 Unspecified acquired deformity of right lower leg: Secondary | ICD-10-CM | POA: Diagnosis not present

## 2017-12-19 DIAGNOSIS — M79672 Pain in left foot: Secondary | ICD-10-CM | POA: Diagnosis not present

## 2018-01-08 DIAGNOSIS — M79672 Pain in left foot: Secondary | ICD-10-CM | POA: Diagnosis not present

## 2018-04-22 DIAGNOSIS — H5203 Hypermetropia, bilateral: Secondary | ICD-10-CM | POA: Diagnosis not present

## 2018-04-22 DIAGNOSIS — H524 Presbyopia: Secondary | ICD-10-CM | POA: Diagnosis not present

## 2018-04-22 DIAGNOSIS — H1789 Other corneal scars and opacities: Secondary | ICD-10-CM | POA: Diagnosis not present

## 2018-04-22 DIAGNOSIS — H52202 Unspecified astigmatism, left eye: Secondary | ICD-10-CM | POA: Diagnosis not present

## 2018-06-28 ENCOUNTER — Telehealth: Payer: Self-pay | Admitting: Obstetrics & Gynecology

## 2018-06-28 NOTE — Telephone Encounter (Signed)
Left patient a message to call back to reschedule a future appointment that was cancelled by the provider. °

## 2018-07-04 ENCOUNTER — Ambulatory Visit: Payer: BLUE CROSS/BLUE SHIELD | Admitting: Obstetrics & Gynecology

## 2018-08-19 ENCOUNTER — Encounter: Payer: Self-pay | Admitting: *Deleted

## 2018-08-20 ENCOUNTER — Ambulatory Visit: Payer: BLUE CROSS/BLUE SHIELD | Admitting: Internal Medicine

## 2018-08-22 ENCOUNTER — Other Ambulatory Visit: Payer: Self-pay

## 2018-08-22 ENCOUNTER — Ambulatory Visit (INDEPENDENT_AMBULATORY_CARE_PROVIDER_SITE_OTHER): Payer: BLUE CROSS/BLUE SHIELD | Admitting: Internal Medicine

## 2018-08-22 ENCOUNTER — Encounter: Payer: Self-pay | Admitting: Internal Medicine

## 2018-08-22 VITALS — Ht 63.5 in | Wt 98.0 lb

## 2018-08-22 DIAGNOSIS — R32 Unspecified urinary incontinence: Secondary | ICD-10-CM | POA: Diagnosis not present

## 2018-08-22 DIAGNOSIS — R151 Fecal smearing: Secondary | ICD-10-CM | POA: Diagnosis not present

## 2018-08-22 DIAGNOSIS — K648 Other hemorrhoids: Secondary | ICD-10-CM

## 2018-08-22 DIAGNOSIS — M6289 Other specified disorders of muscle: Secondary | ICD-10-CM

## 2018-08-22 NOTE — Progress Notes (Signed)
Subjective:   This service was provided via telemedicine.  Doximity app with A/V communication The patient was located at home The provider was located in provider's GI office. The patient did consent to this telephone visit and is aware of possible charges through their insurance for this visit.   The persons participating in this telemedicine service were the patient and I. Time spent on call: 15 minutes    Patient ID: Laurie Griffith, female    DOB: 09-28-1960, 58 y.o.   MRN: [id number]  HPI Laurie Griffith is a 58 year old female with a history of internal hemorrhoids with intermittent rectal bleeding and fecal smearing, history of GERD, history of IBS who is here for follow-up.  She is seen virtually today in the setting of the COVID-19 pandemic.  She was last in the office on 11/08/2017 for hemorrhoidal banding.  She underwent hemorrhoidal banding on 06/29/2017 to the left lateral hemorrhoid, 08/14/2017 to the right anterior hemorrhoid at presented back with symptoms of prolapse and fecal smearing.  Anoscopy revealed persistent hemorrhoid and she had repeat banding on 11/08/2017 to the right anterior internal hemorrhoid.  After this she developed bladder spasms symptom unclear whether this was related to the banding procedure or not, though it did occur very shortly after banding.  She was seen by urology and Myrbetriq was recommended for bladder spasm though she never took this and symptoms resolved.  She has been using Benefiber 2 teaspoons twice daily.  She was also seen in the past by [facility] and had pelvic floor physical therapy.  Per her recollection some of her pelvic muscles had too much tone rather than laxity and therapeutic exercises were recommended.  They seem to help but she slowly stopped doing them on a regular basis.  She reports that she has been having intermittent issues with fecal smearing and "weeping".  This is usually after a bowel movement and seems to come  and go but when present can last about 10 days and then be gone for some time.  She will occasionally see bright red blood with wiping.  Her bowel movements have been much easier on the Benefiber with very little straining.  She does use a stool to lift her legs during defecation which is also helped.  She has had some ongoing intermittent bladder symptoms including spontaneous enuresis often at night.  She was to have her GYN appointment in March but this has been postponed due to COVID-19.  Her plan is to have this rescheduled.   Review of Systems As per HPI, otherwise negative  Current Medications, Allergies, Past Medical History, Past Surgical History, Family History and Social History were reviewed in [employer] record.     Objective:   Physical Exam No physical examination, virtual visit     Assessment & Plan:   58 year old female with a history of internal hemorrhoids with intermittent rectal bleeding and fecal smearing, history of GERD, history of IBS who is here for follow-up.   1.  Fecal smearing/internal hemorrhoids --we discussed the symptoms today.  Her fecal smearing could be due to persistent hemorrhoid preventing complete closure of the anal sphincter but could also relate to pelvic floor dyssynergia or dysfunction.  I recommended that during periods with the symptoms that she increase her Benefiber up to 2 tablespoons twice daily.  If this continues then she can come in for reevaluation and we could even consider anorectal manometry to fully assess anorectal function.  We discussed how this  can overlap with bladder symptoms as well.  I would also recommend when she sees GYN she will be evaluated for rectocele.  If she has further bleeding then I would like to see her back for examination.  I recommended that she consider returning for pelvic floor physical therapy and even biofeedback therapy if felt indicated.  2.  Bladder symptoms/enuresis --she has  been seen by urology.  This symptom may relate to pelvic floor related pathology or even bladder spasm or prolapse.  If this occurs she will have further evaluation.  3.  CRC screening --average risk, screening colonoscopy recommended March 2023  She will return as needed

## 2018-08-22 NOTE — Patient Instructions (Signed)
When you see your GYN, ask to be evaluated for rectocele.   If you have further bleeding, please schedule an in office visit for examination.    Please consider returning for pelvic floor physical therapy and even biofeedback therapy indicated.  You will be due for a recall colonoscopy in 06/2021. We will send you a reminder in the mail when it gets closer to that time.  Return to see [place] as needed.

## 2018-10-17 ENCOUNTER — Encounter: Payer: Self-pay | Admitting: Obstetrics & Gynecology

## 2018-10-17 DIAGNOSIS — Z1231 Encounter for screening mammogram for malignant neoplasm of breast: Secondary | ICD-10-CM | POA: Diagnosis not present

## 2018-10-30 ENCOUNTER — Encounter: Payer: Self-pay | Admitting: Obstetrics & Gynecology

## 2018-10-30 DIAGNOSIS — R922 Inconclusive mammogram: Secondary | ICD-10-CM | POA: Diagnosis not present

## 2018-10-30 DIAGNOSIS — N6489 Other specified disorders of breast: Secondary | ICD-10-CM | POA: Diagnosis not present

## 2018-11-05 ENCOUNTER — Other Ambulatory Visit: Payer: Self-pay

## 2018-11-07 ENCOUNTER — Other Ambulatory Visit: Payer: Self-pay

## 2018-11-07 ENCOUNTER — Encounter: Payer: Self-pay | Admitting: Obstetrics & Gynecology

## 2018-11-07 ENCOUNTER — Other Ambulatory Visit (HOSPITAL_COMMUNITY)
Admission: RE | Admit: 2018-11-07 | Discharge: 2018-11-07 | Disposition: A | Discharge status: Home / Self Care | Payer: BC Managed Care – PPO | Source: Ambulatory Visit | Attending: Obstetrics & Gynecology | Admitting: Obstetrics & Gynecology

## 2018-11-07 ENCOUNTER — Ambulatory Visit (INDEPENDENT_AMBULATORY_CARE_PROVIDER_SITE_OTHER): Payer: BC Managed Care – PPO | Admitting: Obstetrics & Gynecology

## 2018-11-07 VITALS — BP 120/70 | HR 60 | Temp 97.5°F | Ht 63.25 in | Wt 99.0 lb

## 2018-11-07 DIAGNOSIS — Z124 Encounter for screening for malignant neoplasm of cervix: Secondary | ICD-10-CM | POA: Diagnosis not present

## 2018-11-07 DIAGNOSIS — Z01419 Encounter for gynecological examination (general) (routine) without abnormal findings: Secondary | ICD-10-CM | POA: Diagnosis not present

## 2018-11-07 NOTE — Progress Notes (Signed)
58 y.o. G0P0000 Married White or Caucasian female here for annual exam.  Denies vaginal bleeding.  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    GI issues are improved this past year.  She has had hemorrhoidal bleeding.  This is improved after having three hemorrhoids removed.  Reports she's had some bladder spasm issues since the last hemorrhoidal bladder.    Has seen PT at [facility].  This has not helped.    Denies vaginal bleeding.    Patient's last menstrual period was 11/05/2015 (approximate).          Sexually active: No.  The current method of family planning is post menopausal status.    Exercising: Yes.    walk daily, high intensity training  Smoker:  no  Health Maintenance: Pap:  04/23/17 neg  12/16/14 neg. HR HPV:neg  History of abnormal Pap:  no MMG:  09/07/17 BIRADS1:neg.  Had one last week.  Colonoscopy:  06/22/11 f/u 10 years. BMD:   Hand scan  TDaP:  2016 Pneumonia vaccine(s):  n/a Shingrix:   no Hep C testing: 07/30/15 neg  Screening Labs: 2019.  Not needed today.   reports that she has never smoked. She has never used smokeless tobacco. She reports previous alcohol use. She reports that she does not use drugs.  Past Medical History:  Diagnosis Date  . Cyst of left kidney 07/24/2014   simple appearing left mid kidney cyst  . GERD (gastroesophageal reflux disease) 09/2011  . Hiatal hernia   . Internal hemorrhoids   . Overactive bladder   . Pericardial effusion 07/24/2014   trace anterior  . Uterine fibroid 07/24/2014   2.4 cm subserosal left fundal    Past Surgical History:  Procedure Laterality Date  . HEMORRHOID BANDING    . HYSTEROSCOPY  03/30/2008   resction of polyps and D&C  . TONSILLECTOMY AND ADENOIDECTOMY  age 66  . WISDOM TOOTH EXTRACTION  age 3    Current Outpatient Medications  Medication Sig Dispense Refill  . carboxymethylcellulose (REFRESH PLUS) 0.5 % SOLN 1 drop 2 (two) times daily as needed.     . Eyelid  Cleansers (AVENOVA) 0.01 % SOLN Apply topically 2 (two) times daily.    . Probiotic Product (PROBIOTIC-10) CAPS Take 1 capsule by mouth daily.     . Wheat Dextrin (BENEFIBER PO) Take by mouth. 2 Tablespoons daily     No current facility-administered medications for this visit.     Family History  Problem Relation Age of Onset  . Atrial fibrillation Father   . Diabetes Father   . Heart disease Brother   . Colon cancer Maternal Grandmother 51  . Lung cancer Maternal Grandmother 30  . Brain cancer Maternal Grandfather 61  . Rheum arthritis Paternal Grandmother   . Esophageal cancer Neg Hx   . Stomach cancer Neg Hx   . Liver disease Neg Hx     Review of Systems  All other systems reviewed and are negative.   Exam:   BP 120/70   Pulse 60   Temp (!) 97.5 F (36.4 C) (Temporal)   Ht 5' 3.25" (1.607 m)   Wt 99 lb (44.9 kg)   LMP 11/05/2015 (Approximate)   BMI 17.40 kg/m    Height: 5' 3.25" (160.7 cm)  Ht Readings from Last 3 Encounters:  11/07/18 5' 3.25" (1.607 m)  08/22/18 5' 3.5" (1.613 m)  08/19/18 5' 3.5" (1.613 m)    General appearance: alert, cooperative and appears  stated age Head: Normocephalic, without obvious abnormality, atraumatic Neck: no adenopathy, supple, symmetrical, trachea midline and thyroid normal to inspection and palpation Lungs: clear to auscultation bilaterally Breasts: normal appearance, no masses or tenderness Heart: regular rate and rhythm Abdomen: soft, non-tender; bowel sounds normal; no masses,  no organomegaly Extremities: extremities normal, atraumatic, no cyanosis or edema Skin: Skin color, texture, turgor normal. No rashes or lesions Lymph nodes: Cervical, supraclavicular, and axillary nodes normal. No abnormal inguinal nodes palpated Neurologic: Grossly normal   Pelvic: External genitalia:  no lesions              Urethra:  normal appearing urethra with no masses, tenderness or lesions              Bartholins and Skenes: normal                  Vagina: normal appearing vagina with normal color and discharge, no lesions              Cervix: no lesions              Pap taken: Yes.   Bimanual Exam:  Uterus:  normal size, contour, position, consistency, mobility, non-tender              Adnexa: normal adnexa               Rectovaginal: Confirms               Anus:  normal sphincter tone, no lesions  Chaperone was present for exam.  A:  Well Woman with normal exam OAB Hemorrhoids  P:   Mammogram guidelines reviewed. Doing yearly. pap smear with HR HPV obtained today Plan BMD at age 2 Discussed trial of myrbetriq 25mg  for OAB symptoms.  Does not want rx at this time and will consider. Shingrix vaccination return annually or prn

## 2018-11-10 LAB — CYTOLOGY - PAP
Diagnosis: NEGATIVE
HPV: NOT DETECTED

## 2019-01-28 DIAGNOSIS — Z23 Encounter for immunization: Secondary | ICD-10-CM | POA: Diagnosis not present

## 2019-01-28 DIAGNOSIS — L281 Prurigo nodularis: Secondary | ICD-10-CM | POA: Diagnosis not present

## 2019-01-28 DIAGNOSIS — L680 Hirsutism: Secondary | ICD-10-CM | POA: Diagnosis not present

## 2019-01-28 DIAGNOSIS — L309 Dermatitis, unspecified: Secondary | ICD-10-CM | POA: Diagnosis not present

## 2019-07-17 DIAGNOSIS — Z1331 Encounter for screening for depression: Secondary | ICD-10-CM | POA: Diagnosis not present

## 2019-07-17 DIAGNOSIS — R519 Headache, unspecified: Secondary | ICD-10-CM | POA: Diagnosis not present

## 2019-09-05 DIAGNOSIS — Z0000 Encounter for general adult medical examination without abnormal findings: Secondary | ICD-10-CM | POA: Diagnosis not present

## 2019-09-05 DIAGNOSIS — R7989 Other specified abnormal findings of blood chemistry: Secondary | ICD-10-CM | POA: Diagnosis not present

## 2019-09-09 DIAGNOSIS — H52203 Unspecified astigmatism, bilateral: Secondary | ICD-10-CM | POA: Diagnosis not present

## 2019-09-09 DIAGNOSIS — H40033 Anatomical narrow angle, bilateral: Secondary | ICD-10-CM | POA: Diagnosis not present

## 2019-09-12 DIAGNOSIS — Z1331 Encounter for screening for depression: Secondary | ICD-10-CM | POA: Diagnosis not present

## 2019-09-12 DIAGNOSIS — Z0000 Encounter for general adult medical examination without abnormal findings: Secondary | ICD-10-CM | POA: Diagnosis not present

## 2019-10-17 ENCOUNTER — Telehealth: Payer: Self-pay | Admitting: Internal Medicine

## 2019-10-17 NOTE — Telephone Encounter (Signed)
Spoke with patient, patient reports bleeding and inflamed hemorrhoids, pt reports weeping.Pt states that she was using Preparation H cream, pt states that she has multiple hemorrhoids that are so inflamed that she can feel them in her undergarments. Pt advised that she needed to be re-evaluated before she could be referred to have them surgically removed. Pt advised to try Preparation H suppositories, Recticare, pt stated that sitz baths did not help her much. Pt scheduled follow up appt with Ellouise Newer, PA on 11/05/19 at 8:30 am.

## 2019-10-17 NOTE — Telephone Encounter (Signed)
Pt is requesting to speak with a nurse. She has been referred by her pcp for external hemorrhoids. Pt was informed Dr Hilarie Fredrickson doesn't deal with external hemorrhoids but only internal. She needs to be referred to [facility]. Her pcp would like for her to see a GI provider prior to seeing surgical if possible to evaluate her. Pt would like some advice on what she needs to do.

## 2019-10-31 ENCOUNTER — Telehealth: Payer: Self-pay

## 2019-10-31 DIAGNOSIS — Z1231 Encounter for screening mammogram for malignant neoplasm of breast: Secondary | ICD-10-CM | POA: Diagnosis not present

## 2019-10-31 NOTE — Telephone Encounter (Signed)
Per review of 11/07/18 AEX, plan BMD at age 59  Call to patient. Patient reports no change in health, was unsure when BMD was due. Advised per Dr. Sabra Heck.  Patient will cancel BMD appt for now and further discuss recommendations at 01/15/20 AEX. Patient thankful for call.   Routing to provider for final review. Patient is agreeable to disposition. Will close encounter.

## 2019-10-31 NOTE — Telephone Encounter (Signed)
Patient is calling in regards to wanting a bone density order.

## 2019-11-05 ENCOUNTER — Encounter: Payer: Self-pay | Admitting: Physician Assistant

## 2019-11-05 ENCOUNTER — Ambulatory Visit (INDEPENDENT_AMBULATORY_CARE_PROVIDER_SITE_OTHER): Payer: BC Managed Care – PPO | Admitting: Physician Assistant

## 2019-11-05 VITALS — HR 67 | Ht 63.5 in | Wt 100.0 lb

## 2019-11-05 DIAGNOSIS — K644 Residual hemorrhoidal skin tags: Secondary | ICD-10-CM | POA: Diagnosis not present

## 2019-11-05 DIAGNOSIS — R1013 Epigastric pain: Secondary | ICD-10-CM

## 2019-11-05 NOTE — Progress Notes (Signed)
Chief Complaint: Hemorrhoids  HPI:    Laurie Griffith is a 59 year old female with a past medical history as listed below including history of internal hemorrhoids with intermittent rectal bleeding and fecal smearing, GERD and IBS, known to Dr. Hilarie Fredrickson, who presents clinic today with a complaint of hemorrhoids.    08/22/2018 patient had a telemedicine visit with Dr. Hilarie Fredrickson at which time as discussed she underwent hemorrhoidal banding on 06/29/2017 to the left lateral hemorrhoid, 08/14/2017 to the right anterior hemorrhoid and presented back with symptoms of prolapse and fecal smearing.  Anoscopy revealed persistent hemorrhoids she had repeat banding on 11/08/2017 to the right anterior hemorrhoid.  She developed bladder spasms which was unclear whether is related to banding or not.  Symptoms resolved on their own.  She had been using fiber twice daily and seen by pelvic floor physical therapy.  At that time described intermittent issues with fecal smearing and "weeping".  At that time is recommended she increase her fiber up to 2 tablespoons twice a day during periods with symptoms.  It was discussed that they could consider anorectal manometry in the future if it continues to occur.  Is also asked that when she sees her gynecologist to be evaluated for rectocele.  Is recommended she consider returning to pelvic floor physical therapy and even biofeedback therapy.  Her next colonoscopy is recommended in March 2023 for CRC screening.    10/17/2019 patient referred to our clinic for external hemorrhoids.  Patient was advised to use Preparation H suppositories, RectiCare and sitz bath's.    Today, the patient presents to clinic and explains that about a week ago or so she had 2 to 3 days of severe rectal pain and could not even sit on her bottom and felt very large protruding hemorrhoids.  After about 2 to 3 days they "seem to go back inside".  She notes that she did see some clotted blood and darker bleeding at that  time, then she had 2 to 3 days of decreased swelling and really no symptoms at all and she has had no bleeding for the past 5 days.  She does continue to notice some excess tissue there.  She consistently deals with occasional fecal smearing and inability to clean herself.  Denies any pain now.  Has been using over-the-counter Preparation H.    Also discusses a small amount of "burning" in her left upper quadrant/epigastrium typically when she wakes up in the morning.  This is not every day.  Discusses a past diagnosis of visceral hypersensitivity.    Denies fever, chills, weight loss, change in bowel habits or symptoms that awaken her from sleep.  Past Medical History:  Diagnosis Date  . Cyst of left kidney 07/24/2014   simple appearing left mid kidney cyst  . GERD (gastroesophageal reflux disease) 09/2011  . Hiatal hernia   . Internal hemorrhoids   . Overactive bladder   . Pericardial effusion 07/24/2014   trace anterior  . Uterine fibroid 07/24/2014   2.4 cm subserosal left fundal    Past Surgical History:  Procedure Laterality Date  . HEMORRHOID BANDING    . HYSTEROSCOPY  03/30/2008   resction of polyps and D&C  . TONSILLECTOMY AND ADENOIDECTOMY  age 65  . WISDOM TOOTH EXTRACTION  age 82    Current Outpatient Medications  Medication Sig Dispense Refill  . carboxymethylcellulose (REFRESH PLUS) 0.5 % SOLN 1 drop 2 (two) times daily as needed.     . Eyelid Cleansers (AVENOVA) 0.01 %  SOLN Apply topically 2 (two) times daily.    . phenylephrine-shark liver oil-mineral oil-petrolatum (PREPARATION H) 0.25-14-74.9 % rectal ointment Place 1 application rectally 2 (two) times daily as needed for hemorrhoids.    . Probiotic Product (PROBIOTIC-10) CAPS Take 1 capsule by mouth daily.     . Wheat Dextrin (BENEFIBER PO) Take by mouth. 2 Tablespoons daily    . Witch Hazel (PREPARATION H TOTABLES WIPES EX) Apply topically.     No current facility-administered medications for this visit.     Allergies as of 11/05/2019 - Review Complete 11/05/2019  Allergen Reaction Noted  . Doxycycline Nausea Only 12/31/2015  . Wheat bran  04/23/2017  . Penicillins Rash 02/06/2012  . Sulfa antibiotics Rash 11/12/2011  . Sulfasalazine Rash 11/12/2011    Family History  Problem Relation Age of Onset  . Atrial fibrillation Father   . Diabetes Father   . Heart disease Brother   . Colon cancer Maternal Grandmother 52  . Lung cancer Maternal Grandmother 8  . Brain cancer Maternal Grandfather 80  . Rheum arthritis Paternal Grandmother   . Breast cancer Paternal Aunt   . Esophageal cancer Neg Hx   . Stomach cancer Neg Hx   . Liver disease Neg Hx     Social History   Socioeconomic History  . Marital status: Married    Spouse name: Not on file  . Number of children: 0  . Years of education: Not on file  . Highest education level: Not on file  Occupational History  . Occupation: [employer] Employ[employer]    Comment: Nutritional therapist Group  Tobacco Use  . Smoking status: Never Smoker  . Smokeless tobacco: Never Used  Vaping Use  . Vaping Use: Never used  Substance and Sexual Activity  . Alcohol use: Yes    Comment: rarely  . Drug use: No  . Sexual activity: Not Currently    Birth control/protection: Post-menopausal  Other Topics Concern  . Not on file  Social History Narrative  . Not on file   Social Determinants of Health   Financial Resource Strain:   . Difficulty of Paying Living Expenses:   Food Insecurity:   . Worried About Charity fundraiser in the Last Year:   . Arboriculturist in the Last Year:   Transportation Needs:   . Film/video editor (Medical):   Marland Kitchen Lack of Transportation (Non-Medical):   Physical Activity:   . Days of Exercise per Week:   . Minutes of Exercise per Session:   Stress:   . Feeling of Stress :   Social Connections:   . Frequency of Communication with Friends and Family:   . Frequency of Social Gatherings with Friends and Family:    . Attends Religious Services:   . Active Member of Clubs or Organizations:   . Attends Archivist Meetings:   Marland Kitchen Marital Status:   Intimate Partner Violence:   . Fear of Current or Ex-Partner:   . Emotionally Abused:   Marland Kitchen Physically Abused:   . Sexually Abused:     Review of Systems:    Constitutional: No weight loss, fever or chills Cardiovascular: No chest pain  Respiratory: No SOB  Gastrointestinal: See HPI and otherwise negative   Physical Exam:  Vital signs: Pulse 67   Ht 5' 3.5" (1.613 m)   Wt 100 lb (45.4 kg)   LMP 11/05/2015 (Approximate)   SpO2 98%   BMI 17.44 kg/m   Constitutional:   Pleasant  Caucasian female appears to be in NAD, Well developed, Well nourished, alert and cooperative Respiratory: Respirations even and unlabored. Lungs clear to auscultation bilaterally.   No wheezes, crackles, or rhonchi.  Cardiovascular: Normal S1, S2. No MRG. Regular rate and rhythm. No peripheral edema, cyanosis or pallor.  Gastrointestinal:  Soft, nondistended, nontender. No rebound or guarding. Normal bowel sounds. No appreciable masses or hepatomegaly. Rectal: External: Nonthrombosed external hemorrhoids; internal: No mass, no discharge, no tenderness; anoscopy: Normal Psychiatric: Demonstrates good judgement and reason without abnormal affect or behaviors.  No recent labs or imaging.  Assessment: 1.  External hemorrhoids: Seen at time of exam today, were likely thrombosed recently, now no longer pain or bleeding 2.  Epigastric/left upper quadrant pain: Typically in the morning a burning sensation; consider most likely gastritis  Plan: 1.  Discussed external hemorrhoids with the patient.  She was referred to [facility] to discuss hemorrhoidectomy.  Explained that at least then she can have the information in regards this procedure and decide if she would like to proceed or not. 2.  Prescribed Hydrocortisone ointment twice daily x7-14 days with 3 refills. 3.  Discussed sitz  bath's in the future. 4.  Discussed thrombosed hemorrhoids and that likely the clotted blood she thought was from these when they "went back in", rather they broke open and then were reduced. 5.  Explained that patient's next colonoscopy is due in 2023.  If she has an increase in bleeding or becomes concerned then we can schedule this sooner. 6.  Recommend the patient use Pepcid as needed in the mornings to see if this helps with her "burning".  If it does not she can let us know and we can discuss further. 7.  Patient to follow in clinic with [place] as needed.  Ellouise Newer, PA-C [facility] 11/05/2019, 8:34 AM  Cc: No ref. provider found

## 2019-11-05 NOTE — Patient Instructions (Signed)
If you are age 59 or older, your body mass index should be between 23-30. Your Body mass index is 17.44 kg/m. If this is out of the aforementioned range listed, please consider follow up with your Primary Care Provider.  If you are age 38 or younger, your body mass index should be between 19-25. Your Body mass index is 17.44 kg/m. If this is out of the aformentioned range listed, please consider follow up with your Primary Care Provider.   We have sent the following medications to your pharmacy for you to pick up at your convenience: Hydrocortisone ointment twice daily for 1-2 weeks.  Referral placed to [facility] for external hemorrhoids.

## 2019-11-10 ENCOUNTER — Encounter: Payer: Self-pay | Admitting: Obstetrics & Gynecology

## 2019-12-01 NOTE — Progress Notes (Signed)
Addendum: Reviewed and agree with assessment and management plan. Lister Brizzi M, MD  

## 2020-01-12 NOTE — Progress Notes (Signed)
59 y.o. G0P0000 Married White or Caucasian female here for annual exam.  Having some issues with hemorrhoids and some bladder tenderness.  She's had hemorrhoidal banding and after that has experienced some bladder spasms since.  She did see a urologist and exam was normal.  Did also go to integrative therapy as well.  Did a little physical therapy.    Denies vaginal bleeding.    PCP:  Dr. Jacalyn Lefevre.  Had blood work in June.  Cholesterol was elevated but mildly elevated.    Patient's last menstrual period was 11/05/2015 (approximate).          Sexually active: No.  The current method of family planning is post menopausal status.    Exercising: Yes.    walking Smoker:  no  Health Maintenance: Pap:  04-23-17 neg, 11-07-2018 neg HPV HR neg History of abnormal Pap:  no MMG:  10-31-2019  Category c density birads 1:neg Colonoscopy:  06-22-11 f/u 75yrs BMD:   None TDaP:  2016 Pneumonia vaccine(s): not done Shingrix:   Not done Hep C testing: neg 2017 Screening Labs: 09/2019   reports that she has never smoked. She has never used smokeless tobacco. She reports previous alcohol use. She reports that she does not use drugs.  Past Medical History:  Diagnosis Date  . Cyst of left kidney 07/24/2014   simple appearing left mid kidney cyst  . GERD (gastroesophageal reflux disease) 09/2011  . Hiatal hernia   . Internal hemorrhoids   . Overactive bladder   . Pericardial effusion 07/24/2014   trace anterior  . Uterine fibroid 07/24/2014   2.4 cm subserosal left fundal    Past Surgical History:  Procedure Laterality Date  . HEMORRHOID BANDING    . HYSTEROSCOPY  03/30/2008   resction of polyps and D&C  . TONSILLECTOMY AND ADENOIDECTOMY  age 74  . WISDOM TOOTH EXTRACTION  age 12    Current Outpatient Medications  Medication Sig Dispense Refill  . carboxymethylcellulose (REFRESH PLUS) 0.5 % SOLN 1 drop 2 (two) times daily as needed.     . Eyelid Cleansers (AVENOVA) 0.01 % SOLN Apply topically 2  (two) times daily.    . phenylephrine-shark liver oil-mineral oil-petrolatum (PREPARATION H) 0.25-14-74.9 % rectal ointment Place 1 application rectally 2 (two) times daily as needed for hemorrhoids.    . Probiotic Product (PROBIOTIC-10) CAPS Take 1 capsule by mouth daily.     . Wheat Dextrin (BENEFIBER PO) Take by mouth. 2 Tablespoons daily    . Witch Hazel (PREPARATION H TOTABLES WIPES EX) Apply topically.     No current facility-administered medications for this visit.    Family History  Problem Relation Age of Onset  . Atrial fibrillation Father   . Diabetes Father   . Heart disease Brother   . Colon cancer Maternal Grandmother 10  . Lung cancer Maternal Grandmother 89  . Brain cancer Maternal Grandfather 72  . Rheum arthritis Paternal Grandmother   . Breast cancer Paternal Aunt   . Esophageal cancer Neg Hx   . Stomach cancer Neg Hx   . Liver disease Neg Hx     Review of Systems  Constitutional: Negative.   HENT: Negative.   Eyes: Negative.   Respiratory: Negative.   Cardiovascular: Negative.   Gastrointestinal: Negative.   Endocrine: Negative.   Genitourinary: Negative.   Musculoskeletal: Negative.   Skin: Negative.   Allergic/Immunologic: Negative.   Neurological: Negative.   Hematological: Negative.   Psychiatric/Behavioral: Negative.     Exam:  BP 100/64   Pulse 64   Resp 16   Ht 5' 3.25" (1.607 m)   Wt 98 lb (44.5 kg)   LMP 11/05/2015 (Approximate)   BMI 17.22 kg/m   Height: 5' 3.25" (160.7 cm)  General appearance: alert, cooperative and appears stated age Head: Normocephalic, without obvious abnormality, atraumatic Neck: no adenopathy, supple, symmetrical, trachea midline and thyroid normal to inspection and palpation Lungs: clear to auscultation bilaterally Breasts: normal appearance, no masses or tenderness Heart: regular rate and rhythm Abdomen: soft, non-tender; bowel sounds normal; no masses,  no organomegaly Extremities: extremities normal,  atraumatic, no cyanosis or edema Skin: Skin color, texture, turgor normal. No rashes or lesions Lymph nodes: Cervical, supraclavicular, and axillary nodes normal. No abnormal inguinal nodes palpated Neurologic: Grossly normal   Pelvic: External genitalia:  no lesions              Urethra:  normal appearing urethra with no masses, tenderness or lesions              Bartholins and Skenes: normal                 Vagina: normal appearing vagina with normal color and discharge, no lesions              Cervix: no lesions              Pap taken: No. Bimanual Exam:  Uterus:  normal size, contour, position, consistency, mobility, non-tender              Adnexa: normal adnexa and no mass, fullness, tenderness               Rectovaginal: Confirms               Anus:  normal sphincter tone, no lesions  Chaperone, Terence Lux, CMA, was present for exam.  A:  Well Woman with normal exam PMP, no HRT OAB Hemorrhoids  P:   Mammogram guidelines reviewed.  Doing 3D MMG. pap smear with neg HR HPV obtained 2020.  Not indicated today. Options for treatment of hemorrhoids discussed Colonoscopy neg 2013 Plan BMD with MMG in 2022 or 2023. Vaccines reviewed Return annually or prn

## 2020-01-15 ENCOUNTER — Ambulatory Visit (INDEPENDENT_AMBULATORY_CARE_PROVIDER_SITE_OTHER): Payer: BC Managed Care – PPO | Admitting: Obstetrics & Gynecology

## 2020-01-15 ENCOUNTER — Encounter: Payer: Self-pay | Admitting: Obstetrics & Gynecology

## 2020-01-15 ENCOUNTER — Other Ambulatory Visit: Payer: Self-pay

## 2020-01-15 VITALS — BP 100/64 | HR 64 | Resp 16 | Ht 63.25 in | Wt 98.0 lb

## 2020-01-15 DIAGNOSIS — Z01419 Encounter for gynecological examination (general) (routine) without abnormal findings: Secondary | ICD-10-CM

## 2020-01-15 MED ORDER — HYDROCORTISONE (PERIANAL) 2.5 % EX CREA: TOPICAL_CREAM | Freq: Two times a day (BID) | CUTANEOUS | 0 refills | Status: DC

## 2020-01-15 MED ORDER — LIDOCAINE 5 % EX OINT: 1.0000 "application " | TOPICAL_OINTMENT | Freq: Three times a day (TID) | CUTANEOUS | 0 refills | Status: DC | PRN

## 2020-01-15 NOTE — Patient Instructions (Signed)
Can you please send me a copy of your lab work from Dr. Jacalyn Lefevre?

## 2020-08-27 DIAGNOSIS — K5900 Constipation, unspecified: Secondary | ICD-10-CM | POA: Diagnosis not present

## 2020-08-27 DIAGNOSIS — N942 Vaginismus: Secondary | ICD-10-CM | POA: Diagnosis not present

## 2020-08-27 DIAGNOSIS — N3946 Mixed incontinence: Secondary | ICD-10-CM | POA: Diagnosis not present

## 2020-08-27 DIAGNOSIS — R102 Pelvic and perineal pain: Secondary | ICD-10-CM | POA: Diagnosis not present

## 2020-08-30 DIAGNOSIS — G4710 Hypersomnia, unspecified: Secondary | ICD-10-CM | POA: Diagnosis not present

## 2020-09-13 DIAGNOSIS — N942 Vaginismus: Secondary | ICD-10-CM | POA: Diagnosis not present

## 2020-09-13 DIAGNOSIS — N3946 Mixed incontinence: Secondary | ICD-10-CM | POA: Diagnosis not present

## 2020-09-13 DIAGNOSIS — K5900 Constipation, unspecified: Secondary | ICD-10-CM | POA: Diagnosis not present

## 2020-09-13 DIAGNOSIS — R102 Pelvic and perineal pain: Secondary | ICD-10-CM | POA: Diagnosis not present

## 2020-09-15 DIAGNOSIS — E785 Hyperlipidemia, unspecified: Secondary | ICD-10-CM | POA: Diagnosis not present

## 2020-09-15 DIAGNOSIS — N3946 Mixed incontinence: Secondary | ICD-10-CM | POA: Diagnosis not present

## 2020-09-15 DIAGNOSIS — K5900 Constipation, unspecified: Secondary | ICD-10-CM | POA: Diagnosis not present

## 2020-09-15 DIAGNOSIS — Z0000 Encounter for general adult medical examination without abnormal findings: Secondary | ICD-10-CM | POA: Diagnosis not present

## 2020-09-15 DIAGNOSIS — R102 Pelvic and perineal pain: Secondary | ICD-10-CM | POA: Diagnosis not present

## 2020-09-15 DIAGNOSIS — N942 Vaginismus: Secondary | ICD-10-CM | POA: Diagnosis not present

## 2020-09-22 DIAGNOSIS — N3946 Mixed incontinence: Secondary | ICD-10-CM | POA: Diagnosis not present

## 2020-09-22 DIAGNOSIS — K5900 Constipation, unspecified: Secondary | ICD-10-CM | POA: Diagnosis not present

## 2020-09-22 DIAGNOSIS — N942 Vaginismus: Secondary | ICD-10-CM | POA: Diagnosis not present

## 2020-09-22 DIAGNOSIS — R102 Pelvic and perineal pain: Secondary | ICD-10-CM | POA: Diagnosis not present

## 2020-09-29 DIAGNOSIS — R82998 Other abnormal findings in urine: Secondary | ICD-10-CM | POA: Diagnosis not present

## 2020-09-29 DIAGNOSIS — Z0000 Encounter for general adult medical examination without abnormal findings: Secondary | ICD-10-CM | POA: Diagnosis not present

## 2020-09-29 DIAGNOSIS — Z1331 Encounter for screening for depression: Secondary | ICD-10-CM | POA: Diagnosis not present

## 2020-09-29 DIAGNOSIS — E785 Hyperlipidemia, unspecified: Secondary | ICD-10-CM | POA: Diagnosis not present

## 2020-09-30 ENCOUNTER — Other Ambulatory Visit: Payer: Self-pay | Admitting: Internal Medicine

## 2020-09-30 DIAGNOSIS — E785 Hyperlipidemia, unspecified: Secondary | ICD-10-CM

## 2020-10-01 DIAGNOSIS — N942 Vaginismus: Secondary | ICD-10-CM | POA: Diagnosis not present

## 2020-10-01 DIAGNOSIS — N3946 Mixed incontinence: Secondary | ICD-10-CM | POA: Diagnosis not present

## 2020-10-01 DIAGNOSIS — K5900 Constipation, unspecified: Secondary | ICD-10-CM | POA: Diagnosis not present

## 2020-10-01 DIAGNOSIS — R102 Pelvic and perineal pain: Secondary | ICD-10-CM | POA: Diagnosis not present

## 2020-10-08 DIAGNOSIS — R102 Pelvic and perineal pain: Secondary | ICD-10-CM | POA: Diagnosis not present

## 2020-10-18 DIAGNOSIS — F419 Anxiety disorder, unspecified: Secondary | ICD-10-CM | POA: Diagnosis not present

## 2020-10-20 DIAGNOSIS — G4486 Cervicogenic headache: Secondary | ICD-10-CM | POA: Diagnosis not present

## 2020-10-20 DIAGNOSIS — M542 Cervicalgia: Secondary | ICD-10-CM | POA: Diagnosis not present

## 2020-10-25 DIAGNOSIS — F419 Anxiety disorder, unspecified: Secondary | ICD-10-CM | POA: Diagnosis not present

## 2020-11-01 ENCOUNTER — Ambulatory Visit
Admission: RE | Admit: 2020-11-01 | Discharge: 2020-11-01 | Disposition: A | Discharge status: Home / Self Care | Payer: No Typology Code available for payment source | Source: Ambulatory Visit | Attending: Internal Medicine | Admitting: Internal Medicine

## 2020-11-01 DIAGNOSIS — E785 Hyperlipidemia, unspecified: Secondary | ICD-10-CM

## 2020-11-03 DIAGNOSIS — M5386 Other specified dorsopathies, lumbar region: Secondary | ICD-10-CM | POA: Diagnosis not present

## 2020-11-03 DIAGNOSIS — M6289 Other specified disorders of muscle: Secondary | ICD-10-CM | POA: Diagnosis not present

## 2020-11-03 DIAGNOSIS — M542 Cervicalgia: Secondary | ICD-10-CM | POA: Diagnosis not present

## 2020-11-03 DIAGNOSIS — G4486 Cervicogenic headache: Secondary | ICD-10-CM | POA: Diagnosis not present

## 2020-11-04 DIAGNOSIS — F419 Anxiety disorder, unspecified: Secondary | ICD-10-CM | POA: Diagnosis not present

## 2020-11-05 DIAGNOSIS — Z1231 Encounter for screening mammogram for malignant neoplasm of breast: Secondary | ICD-10-CM | POA: Diagnosis not present

## 2020-11-09 ENCOUNTER — Encounter (HOSPITAL_BASED_OUTPATIENT_CLINIC_OR_DEPARTMENT_OTHER): Payer: Self-pay | Admitting: Obstetrics & Gynecology

## 2020-11-09 ENCOUNTER — Encounter (HOSPITAL_BASED_OUTPATIENT_CLINIC_OR_DEPARTMENT_OTHER): Payer: Self-pay

## 2020-11-09 DIAGNOSIS — G4486 Cervicogenic headache: Secondary | ICD-10-CM | POA: Diagnosis not present

## 2020-11-10 DIAGNOSIS — H40033 Anatomical narrow angle, bilateral: Secondary | ICD-10-CM | POA: Diagnosis not present

## 2020-11-10 DIAGNOSIS — H1789 Other corneal scars and opacities: Secondary | ICD-10-CM | POA: Diagnosis not present

## 2020-11-10 DIAGNOSIS — H5203 Hypermetropia, bilateral: Secondary | ICD-10-CM | POA: Diagnosis not present

## 2020-11-17 DIAGNOSIS — F419 Anxiety disorder, unspecified: Secondary | ICD-10-CM | POA: Diagnosis not present

## 2020-11-17 DIAGNOSIS — G4486 Cervicogenic headache: Secondary | ICD-10-CM | POA: Diagnosis not present

## 2020-11-25 DIAGNOSIS — F419 Anxiety disorder, unspecified: Secondary | ICD-10-CM | POA: Diagnosis not present

## 2020-11-29 DIAGNOSIS — G4486 Cervicogenic headache: Secondary | ICD-10-CM | POA: Diagnosis not present

## 2020-11-30 DIAGNOSIS — F419 Anxiety disorder, unspecified: Secondary | ICD-10-CM | POA: Diagnosis not present

## 2020-12-01 ENCOUNTER — Encounter: Payer: Self-pay | Admitting: Obstetrics and Gynecology

## 2020-12-01 ENCOUNTER — Ambulatory Visit (INDEPENDENT_AMBULATORY_CARE_PROVIDER_SITE_OTHER): Payer: BC Managed Care – PPO | Admitting: Obstetrics and Gynecology

## 2020-12-01 ENCOUNTER — Other Ambulatory Visit: Payer: Self-pay

## 2020-12-01 VITALS — BP 109/74 | HR 64 | Ht 63.5 in | Wt 98.0 lb

## 2020-12-01 DIAGNOSIS — R351 Nocturia: Secondary | ICD-10-CM | POA: Diagnosis not present

## 2020-12-01 DIAGNOSIS — R3915 Urgency of urination: Secondary | ICD-10-CM

## 2020-12-01 DIAGNOSIS — M62838 Other muscle spasm: Secondary | ICD-10-CM | POA: Diagnosis not present

## 2020-12-01 DIAGNOSIS — N942 Vaginismus: Secondary | ICD-10-CM | POA: Diagnosis not present

## 2020-12-01 LAB — POCT URINALYSIS DIPSTICK
Appearance: NORMAL
Bilirubin, UA: NEGATIVE
Blood, UA: NEGATIVE
Glucose, UA: NEGATIVE
Ketones, UA: NEGATIVE
Leukocytes, UA: NEGATIVE
Nitrite, UA: NEGATIVE
Protein, UA: NEGATIVE
Spec Grav, UA: 1.01 (ref 1.010–1.025)
Urobilinogen, UA: 0.2 E.U./dL
pH, UA: 6.5 (ref 5.0–8.0)

## 2020-12-01 MED ORDER — ESTRADIOL 0.1 MG/GM VA CREA: 0.5000 g | TOPICAL_CREAM | VAGINAL | 11 refills | Status: DC

## 2020-12-01 MED ORDER — LIDOCAINE HCL URETHRAL/MUCOSAL 2 % EX PRSY: 1.0000 "application " | PREFILLED_SYRINGE | CUTANEOUS | 5 refills | Status: DC | PRN

## 2020-12-01 NOTE — Patient Instructions (Signed)
Start vaginal estrogen therapy nightly for two weeks then 2 times weekly at night for treatment of vaginal atrophy (dryness of the vaginal tissues).  Please let us know if the prescription is too expensive and we can look for alternative options.   Use lidocaine jelly as needed at opening of vagina.

## 2020-12-01 NOTE — Progress Notes (Signed)
[facility] New Patient Evaluation and Consultation  Referring Provider: Megan Salon, MD PCP: Michael Boston, MD Date of Service: 12/01/2020  SUBJECTIVE Chief Complaint: New Patient (Initial Visit) (PVR-10)- pelvic pain  History of Present Illness: Laurie Griffith is a 60 y.o. White or Caucasian female presenting for evaluation of bladder pain.    Review of records from Dr Sabra Heck significant for: Reports bladder spasms since having hemorrhoids banded  Urinary Symptoms: Leaks urine while asleep- happened one time. Sometimes will notice a little urine on her pad with passing gas.  Leaks less than 1/day Pad use: liners/ mini-pads She is bothered by her UI symptoms. Denies snoring.   Day time voids 3-4.  Nocturia: 1-2 times per night to void. Not every night.  Voiding dysfunction: she empties her bladder well.  does not use a catheter to empty bladder.  When urinating, she feels she has no difficulties Stops drinking around 7pm and goes to bed at 10pm.   UTIs:  0  UTI's in the last year.   Denies history of blood in urine and kidney or bladder stones  Pelvic Organ Prolapse Symptoms:                  She Denies a feeling of a bulge the vaginal area.   Bowel Symptom: Bowel movements: 1 time(s) per day Stool consistency: hard or soft  Straining: yes.  Splinting: no.  Incomplete evacuation: no.  She Admits to accidental bowel leakage / fecal incontinence  Occurs: rarely  Consistency with leakage: liquid Bowel regimen: diet and fiber Last colonoscopy: Date 06/2011, Results- negative  Sexual Function Sexually active: no-  Sexual orientation:  heterosexual Pain with sex: Yes, at the vaginal opening, has discomfort due to dryness, feels tight (psycological and physical) Has had vaginismus for a long time. Used to have a fear of getting pregnant.   Pelvic Pain Admits to pelvic pain Pain occurs: intermittently Prior pain treatment: pelvic floor  relaxation,tighten pelvic floor Sometimes has some sharp pain deeper in the pelvis. Also feels a pulsation at the entrance of the vagina and on the left lower quadrant.  [facility] and worked on exercises for relaxation. Had a different pelvic floor therapist say that her muscles were too weak.   Past Medical History:  Past Medical History:  Diagnosis Date   Cyst of left kidney 07/24/2014   simple appearing left mid kidney cyst   GERD (gastroesophageal reflux disease) 09/2011   Hiatal hernia    Internal hemorrhoids    Overactive bladder    Pericardial effusion 07/24/2014   trace anterior   Uterine fibroid 07/24/2014   2.4 cm subserosal left fundal     Past Surgical History:   Past Surgical History:  Procedure Laterality Date   HEMORRHOID BANDING     HYSTEROSCOPY  03/30/2008   resction of polyps and D&C   TONSILLECTOMY AND ADENOIDECTOMY  age 50   WISDOM TOOTH EXTRACTION  age 19     Past OB/GYN History: OB History  Gravida Para Term Preterm AB Living  0 0 0 0 0 0  SAB IAB Ectopic Multiple Live Births  0 0 0 0      Menopausal: Yes, at age 89, Denies vaginal bleeding since menopause Contraception: n/a. Last pap smear was oct 2021- neg.  Any history of abnormal pap smears: no.   Medications: She has a current medication list which includes the following prescription(s): carboxymethylcellulose, [START ON  12/02/2020] estradiol, avenova, lidocaine, preparation h, wheat dextrin, and witch hazel.   Allergies: Patient is allergic to doxycycline, food, penicillins, sulfa antibiotics, and sulfasalazine.   Social History:  Social History   Tobacco Use   Smoking status: Never   Smokeless tobacco: Never  Vaping Use   Vaping Use: Never used  Substance Use Topics   Alcohol use: Not Currently   Drug use: No    Relationship status: married She lives with husband.   She is employed as an Teacher, English as a foreign language. Regular exercise: Yes:  walks 60 min, 6 days a week History of abuse: No  Family History:   Family History  Problem Relation Age of Onset   Atrial fibrillation Father    Diabetes Father    Heart disease Brother    Colon cancer Maternal Grandmother 60   Lung cancer Maternal Grandmother 97   Brain cancer Maternal Grandfather 85   Rheum arthritis Paternal Grandmother    Breast cancer Paternal Aunt    Esophageal cancer Neg Hx    Stomach cancer Neg Hx    Liver disease Neg Hx      Review of Systems: Review of Systems  Constitutional:  Positive for weight loss. Negative for fever and malaise/fatigue.  Respiratory:  Negative for cough, shortness of breath and wheezing.   Cardiovascular:  Negative for chest pain, palpitations and leg swelling.  Gastrointestinal:  Negative for abdominal pain and blood in stool.  Genitourinary:  Negative for dysuria.  Musculoskeletal:  Negative for myalgias.  Skin:  Negative for rash.  Neurological:  Negative for dizziness and headaches.  Endo/Heme/Allergies:  Does not bruise/bleed easily.       + hot flashes  Psychiatric/Behavioral:  Negative for depression. The patient is not nervous/anxious.     OBJECTIVE Physical Exam: Vitals:   12/01/20 0837  BP: 109/74  Pulse: 64  Weight: 98 lb (44.5 kg)  Height: 5' 3.5" (1.613 m)  Body mass index is 17.09 kg/m.   Physical Exam Constitutional:      General: She is not in acute distress. Pulmonary:     Effort: Pulmonary effort is normal.  Abdominal:     General: There is no distension.     Palpations: Abdomen is soft.     Tenderness: There is no abdominal tenderness. There is no rebound.  Musculoskeletal:        General: No swelling. Normal range of motion.  Skin:    General: Skin is warm and dry.     Findings: No rash.  Neurological:     Mental Status: She is alert and oriented to person, place, and time.  Psychiatric:        Mood and Affect: Mood normal.        Behavior: Behavior normal.     GU / Detailed  Urogynecologic Evaluation:  Pelvic Exam: Normal external female genitalia; Bartholin's and Skene's glands normal in appearance; urethral meatus normal with small caruncle, no urethral masses or discharge.   Qtip test- allodynia at introitus, no pain on vulva  Speculum exam reveals normal vaginal mucosa with atrophy. Cervix normal appearance. Uterus normal single, nontender. Adnexa no mass, fullness, tenderness.     Pelvic floor strength III/V  Pelvic floor musculature: Right levator tender, Right obturator tender, Left levator tender, Left obturator tender. Reproduces sharp pelvic pain.   POP-Q:  Deferred, no prolapse   Rectal Exam:  Hemorrhoids externally  Post-Void Residual (PVR) by Bladder Scan: In order to evaluate bladder emptying, we discussed obtaining a postvoid residual  and she agreed to this procedure.  Procedure: The ultrasound unit was placed on the patient's abdomen in the suprapubic region after the patient had voided. A PVR of 10 ml was obtained by bladder scan.  Laboratory Results: POC urine: negative  ASSESSMENT AND PLAN Ms. Cumplido is a 60 y.o. with:  1. Levator spasm   2. Vaginismus   3. Urinary urgency   4. Nocturia    Levator spasm The origin of pelvic floor muscle spasm can be multifactorial, including primary, reactive to a different pain source, trauma, or even part of a centralized pain syndrome.Treatment options include pelvic floor physical therapy, local (vaginal) or oral  muscle relaxants, pelvic muscle trigger point injections or centrally acting pain medications.   - She will work on IT trainer - would like to see pelvic floor PT, referral placed - Discussed using dilator or pelvic wand or finger to help with muscle relaxation. She would like to avoid medications at this time.   2. Vaginismus - Prescribed estrogen 0.5g nightly for two weeks then twice weekly as atrophy can be contributing - Lidocaine jelly 2% prescribed to use as  needed at the introitus. She should also use this prior to dilator therapy. Discussed using increasing sized dilators to help with insertion.   3. Urgency/ nocturia - has not been an issue lately for her. Suspect that symptoms are related to pelvic floor muscle spasm. Can reassess at next visit.   Return 3 months for follow up   Jaquita Folds, MD  Time spent: I spent 50 minutes dedicated to the care of this patient on the date of this encounter to include pre-visit review of records, face-to-face time with the patient discussing treatment and post visit documentation and ordering medication/ testing.

## 2020-12-02 DIAGNOSIS — G4486 Cervicogenic headache: Secondary | ICD-10-CM | POA: Diagnosis not present

## 2020-12-07 DIAGNOSIS — M6289 Other specified disorders of muscle: Secondary | ICD-10-CM | POA: Diagnosis not present

## 2020-12-07 DIAGNOSIS — M6281 Muscle weakness (generalized): Secondary | ICD-10-CM | POA: Diagnosis not present

## 2020-12-07 DIAGNOSIS — G4486 Cervicogenic headache: Secondary | ICD-10-CM | POA: Diagnosis not present

## 2020-12-07 DIAGNOSIS — M542 Cervicalgia: Secondary | ICD-10-CM | POA: Diagnosis not present

## 2020-12-09 DIAGNOSIS — F419 Anxiety disorder, unspecified: Secondary | ICD-10-CM | POA: Diagnosis not present

## 2020-12-14 DIAGNOSIS — G4486 Cervicogenic headache: Secondary | ICD-10-CM | POA: Diagnosis not present

## 2020-12-16 DIAGNOSIS — F419 Anxiety disorder, unspecified: Secondary | ICD-10-CM | POA: Diagnosis not present

## 2020-12-20 DIAGNOSIS — G4486 Cervicogenic headache: Secondary | ICD-10-CM | POA: Diagnosis not present

## 2020-12-22 DIAGNOSIS — G4486 Cervicogenic headache: Secondary | ICD-10-CM | POA: Diagnosis not present

## 2020-12-22 DIAGNOSIS — G4710 Hypersomnia, unspecified: Secondary | ICD-10-CM | POA: Diagnosis not present

## 2020-12-23 DIAGNOSIS — F419 Anxiety disorder, unspecified: Secondary | ICD-10-CM | POA: Diagnosis not present

## 2020-12-29 DIAGNOSIS — H40032 Anatomical narrow angle, left eye: Secondary | ICD-10-CM | POA: Diagnosis not present

## 2020-12-30 DIAGNOSIS — F419 Anxiety disorder, unspecified: Secondary | ICD-10-CM | POA: Diagnosis not present

## 2020-12-30 DIAGNOSIS — G4486 Cervicogenic headache: Secondary | ICD-10-CM | POA: Diagnosis not present

## 2021-01-03 DIAGNOSIS — G4486 Cervicogenic headache: Secondary | ICD-10-CM | POA: Diagnosis not present

## 2021-01-05 DIAGNOSIS — H40031 Anatomical narrow angle, right eye: Secondary | ICD-10-CM | POA: Diagnosis not present

## 2021-01-06 DIAGNOSIS — G4486 Cervicogenic headache: Secondary | ICD-10-CM | POA: Diagnosis not present

## 2021-01-06 DIAGNOSIS — F419 Anxiety disorder, unspecified: Secondary | ICD-10-CM | POA: Diagnosis not present

## 2021-01-18 ENCOUNTER — Encounter: Payer: Self-pay | Admitting: Obstetrics and Gynecology

## 2021-01-28 ENCOUNTER — Other Ambulatory Visit: Payer: Self-pay

## 2021-01-28 ENCOUNTER — Ambulatory Visit (INDEPENDENT_AMBULATORY_CARE_PROVIDER_SITE_OTHER): Payer: BC Managed Care – PPO | Admitting: Obstetrics & Gynecology

## 2021-01-28 ENCOUNTER — Encounter (HOSPITAL_BASED_OUTPATIENT_CLINIC_OR_DEPARTMENT_OTHER): Payer: Self-pay | Admitting: Obstetrics & Gynecology

## 2021-01-28 VITALS — BP 116/87 | HR 69 | Ht 63.5 in | Wt 98.6 lb

## 2021-01-28 DIAGNOSIS — Z780 Asymptomatic menopausal state: Secondary | ICD-10-CM | POA: Diagnosis not present

## 2021-01-28 DIAGNOSIS — Z01419 Encounter for gynecological examination (general) (routine) without abnormal findings: Secondary | ICD-10-CM | POA: Diagnosis not present

## 2021-01-28 DIAGNOSIS — M62838 Other muscle spasm: Secondary | ICD-10-CM | POA: Diagnosis not present

## 2021-01-28 DIAGNOSIS — E2839 Other primary ovarian failure: Secondary | ICD-10-CM

## 2021-01-28 DIAGNOSIS — K649 Unspecified hemorrhoids: Secondary | ICD-10-CM

## 2021-01-28 NOTE — Progress Notes (Signed)
60 y.o. G0P0000 Married White or Caucasian female here for annual exam.  Has seen Dr. Wannetta Sender.  Was referred to PT but there has been some sort of "disconnect" in getting an appt.  Referral will be placed again today to [place] location.   Denies vaginal bleeding.  Has some questions about estradiol cream that was prescribed.    Patient's last menstrual period was 11/05/2015 (approximate).          Sexually active: Yes.    The current method of family planning is post menopausal status.    Exercising: Yes.     Smoker:  no  Health Maintenance: Pap:  11/07/2018 Negative History of abnormal Pap:  no MMG:  11/05/2020 Negative Colonoscopy:  06/22/2011 BMD:   order placed Screening Labs: Dr. Jacalyn Lefevre   reports that she has never smoked. She has never used smokeless tobacco. She reports that she does not currently use alcohol. She reports that she does not use drugs.  Past Medical History:  Diagnosis Date   Cyst of left kidney 07/24/2014   simple appearing left mid kidney cyst   GERD (gastroesophageal reflux disease) 09/2011   Hiatal hernia    Internal hemorrhoids    Overactive bladder    Pericardial effusion 07/24/2014   trace anterior   Uterine fibroid 07/24/2014   2.4 cm subserosal left fundal    Past Surgical History:  Procedure Laterality Date   HEMORRHOID BANDING     HYSTEROSCOPY  03/30/2008   resction of polyps and D&C   TONSILLECTOMY AND ADENOIDECTOMY  age 84   WISDOM TOOTH EXTRACTION  age 41    Current Outpatient Medications  Medication Sig Dispense Refill   carboxymethylcellulose (REFRESH PLUS) 0.5 % SOLN 1 drop 2 (two) times daily as needed.      Eyelid Cleansers (AVENOVA) 0.01 % SOLN Apply topically 2 (two) times daily.     Wheat Dextrin (BENEFIBER PO) Take by mouth. 2 Tablespoons daily     estradiol (ESTRACE) 0.1 MG/GM vaginal cream Place 0.5 g vaginally 2 (two) times a week. Place 0.5g nightly for two weeks then twice a week after (Patient not taking: Reported on  01/28/2021) 30 g 11   lidocaine (GLYDO) 2 % jelly Apply 1 application topically as needed. (Patient not taking: Reported on 01/28/2021) 20 mL 5   phenylephrine-shark liver oil-mineral oil-petrolatum (PREPARATION H) 0.25-14-74.9 % rectal ointment Place 1 application rectally 2 (two) times daily as needed for hemorrhoids. (Patient not taking: Reported on 01/28/2021)     Witch Hazel (PREPARATION H TOTABLES WIPES EX) Apply topically. (Patient not taking: Reported on 01/28/2021)     No current facility-administered medications for this visit.    Family History  Problem Relation Age of Onset   Atrial fibrillation Father    Diabetes Father    Heart disease Brother    Colon cancer Maternal Grandmother 46   Lung cancer Maternal Grandmother 63   Brain cancer Maternal Grandfather 85   Rheum arthritis Paternal Grandmother    Breast cancer Paternal Aunt    Esophageal cancer Neg Hx    Stomach cancer Neg Hx    Liver disease Neg Hx     Review of Systems  All other systems reviewed and are negative.  Exam:   BP 116/87 (BP Location: Right Arm, Patient Position: Sitting, Cuff Size: Normal)   Pulse 69   Ht 5' 3.5" (1.613 m)   Wt 98 lb 9.6 oz (44.7 kg)   LMP 11/05/2015 (Approximate)   BMI 17.19 kg/m  Height: 5' 3.5" (161.3 cm)  General appearance: alert, cooperative and appears stated age Head: Normocephalic, without obvious abnormality, atraumatic Neck: no adenopathy, supple, symmetrical, trachea midline and thyroid normal to inspection and palpation Lungs: clear to auscultation bilaterally Breasts: normal appearance, no masses or tenderness Heart: regular rate and rhythm Abdomen: soft, non-tender; bowel sounds normal; no masses,  no organomegaly Extremities: extremities normal, atraumatic, no cyanosis or edema Skin: Skin color, texture, turgor normal. No rashes or lesions Lymph nodes: Cervical, supraclavicular, and axillary nodes normal. No abnormal inguinal nodes palpated Neurologic:  Grossly normal   Pelvic: External genitalia:  no lesions              Urethra:  normal appearing urethra with no masses, tenderness or lesions              Bartholins and Skenes: normal                 Vagina: normal appearing vagina with normal color and no discharge, no lesions              Cervix: no lesions              Pap taken: No. Bimanual Exam:  Uterus:  normal size, contour, position, consistency, mobility, non-tender              Adnexa: normal adnexa and no mass, fullness, tenderness               Rectovaginal: Confirms               Anus:  normal sphincter tone, no lesions  Chaperone, Octaviano Batty, CMA, was present for exam.  Assessment/Plan: 1. Well woman exam with routine gynecological exam - pap with neg HR HPV 2020.  Will repeat nextyear. - MMG 10/2020 - colonoscopy 06/2011.  Follow up 10 years. - BMD order placed for next year with MMG - lab work done with Dr. Jacalyn Lefevre - care gaps updated/reviewed  2. Levator spasm - Ambulatory referral to Physical Therapy  3. Postmenopausal - DG BONE DENSITY (DXA); Future - has rx for estrace vaginal cream.  Directions for use discussed due to questions she had today.  4. Hemorrhoids, unspecified hemorrhoid type

## 2021-01-29 ENCOUNTER — Encounter (HOSPITAL_BASED_OUTPATIENT_CLINIC_OR_DEPARTMENT_OTHER): Payer: Self-pay | Admitting: Obstetrics & Gynecology

## 2021-02-06 ENCOUNTER — Encounter (HOSPITAL_BASED_OUTPATIENT_CLINIC_OR_DEPARTMENT_OTHER): Payer: Self-pay

## 2021-02-19 ENCOUNTER — Encounter (HOSPITAL_BASED_OUTPATIENT_CLINIC_OR_DEPARTMENT_OTHER): Payer: Self-pay

## 2021-02-21 ENCOUNTER — Other Ambulatory Visit (HOSPITAL_BASED_OUTPATIENT_CLINIC_OR_DEPARTMENT_OTHER): Payer: Self-pay | Admitting: Obstetrics & Gynecology

## 2021-02-21 DIAGNOSIS — E2839 Other primary ovarian failure: Secondary | ICD-10-CM

## 2021-02-21 DIAGNOSIS — Z780 Asymptomatic menopausal state: Secondary | ICD-10-CM

## 2021-03-30 DIAGNOSIS — H18452 Nodular corneal degeneration, left eye: Secondary | ICD-10-CM | POA: Diagnosis not present

## 2021-04-12 ENCOUNTER — Ambulatory Visit: Payer: BC Managed Care – PPO | Admitting: Physical Therapy

## 2021-04-22 ENCOUNTER — Ambulatory Visit: Payer: No Typology Code available for payment source | Admitting: Physical Therapy

## 2021-04-28 DIAGNOSIS — M542 Cervicalgia: Secondary | ICD-10-CM | POA: Diagnosis not present

## 2021-04-28 DIAGNOSIS — G4486 Cervicogenic headache: Secondary | ICD-10-CM | POA: Diagnosis not present

## 2021-05-10 ENCOUNTER — Encounter: Payer: Self-pay | Admitting: Physical Therapy

## 2021-05-10 ENCOUNTER — Ambulatory Visit: Payer: BC Managed Care – PPO | Attending: Physician Assistant | Admitting: Physical Therapy

## 2021-05-10 ENCOUNTER — Other Ambulatory Visit: Payer: Self-pay

## 2021-05-10 DIAGNOSIS — M62838 Other muscle spasm: Secondary | ICD-10-CM | POA: Insufficient documentation

## 2021-05-10 DIAGNOSIS — M6281 Muscle weakness (generalized): Secondary | ICD-10-CM | POA: Diagnosis not present

## 2021-05-10 NOTE — Therapy (Signed)
[facility] @ [address] [place], [place], [address] Phone: [phone]   Fax:  [phone]  Physical Therapy Evaluation  Patient Details  Name: Laurie Griffith MRN: [id number] Date of Birth: June 18, 1960 Referring Provider (PT): Megan Salon, MD   Encounter Date: 05/10/2021   PT End of Session - 05/10/21 1655     Visit Number 1    Date for PT Re-Evaluation 08/02/21    Authorization Type BCBS    PT Start Time 1401    PT Stop Time 1442    PT Time Calculation (min) 41 min    Activity Tolerance Patient tolerated treatment well    Behavior During Therapy [facility] for tasks assessed/performed             Past Medical History:  Diagnosis Date   Cyst of left kidney 07/24/2014   simple appearing left mid kidney cyst   GERD (gastroesophageal reflux disease) 09/2011   Hiatal hernia    Internal hemorrhoids    Overactive bladder    Pericardial effusion 07/24/2014   trace anterior   Uterine fibroid 07/24/2014   2.4 cm subserosal left fundal    Past Surgical History:  Procedure Laterality Date   HEMORRHOID BANDING     HYSTEROSCOPY  03/30/2008   resction of polyps and D&C   TONSILLECTOMY AND ADENOIDECTOMY  age 41   WISDOM TOOTH EXTRACTION  age 17    There were no vitals filed for this visit.    Subjective Assessment - 05/10/21 1404     Subjective I feel the muscle pulsing or twitching, sometimes having bladder leakage.  It will happen a little bit and sometimes just feels like it released but.  I have a lot of gas and sometimes will pee when I release.    Patient Stated Goals stop having the pulsing and not leakage    Currently in Pain? No/denies                [facility] PT Assessment - 05/10/21 0001       Assessment   Medical Diagnosis M62.838 (ICD-10-CM) - Levator spasm    Referring Provider (PT) Megan Salon, MD    Prior Therapy last summer for pelvic floor      Precautions   Precautions None       Balance Screen   Has the patient fallen in the past 6 months No      [place] residence    Living Arrangements Spouse/significant other      Prior Function   Level of Independence Independent    Vocation Full time employment    Vocation Requirements sitting - executive coach      Cognition   Overall Cognitive Status Within Functional Limits for tasks assessed      Functional Tests   Functional tests Single leg stance      Single Leg Stance   Comments mild trendelenburg  Lt side      Posture/Postural Control   Posture/Postural Control Postural limitations    Postural Limitations Weight shift right;Left pelvic obliquity   left pelvic upslip   Posture Comments tension with rotation to the left and standing with lean to the right      ROM / Strength   AROM / PROM / Strength AROM;PROM;Strength      AROM   Overall AROM Comments lumbar flexion 60%      PROM   Overall PROM Comments hip [facility]  Strength   Overall Strength Comments 5/5      Flexibility   Soft Tissue Assessment /Muscle Length yes    Hamstrings 80%      Palpation   Palpation comment hamstring, abdominal, gluteals, thoracic and lubmar paraspinals tight      Special Tests   Other special tests ASLR - harder with pelvic compression, easy with glute support      Ambulation/Gait   Gait Pattern Within Functional Limits                        Objective measurements completed on examination: See above findings.     Pelvic Floor Special Questions - 05/10/21 0001     Prior Pelvic/Prostate Exam Yes    Are you Pregnant or attempting pregnancy? No    Prior Pregnancies No    Currently Sexually Active No    Marinoff Scale pain prevents any attempts at intercourse    Urinary Leakage Yes    How often daily    Pad use no    Activities that cause leaking Other    Other activities that cause leaking just happens    Urinary urgency No    Urinary frequency normal     Fecal incontinence --   used to have constipation, managed with metamucil   Falling out feeling (prolapse) No    Skin Integrity --   dry and light color   Perineal Body/Introitus  Elevated    External Palpation adductors tight    Prolapse None    Pelvic Floor Internal Exam pt identity confirmed and informed consent given    Exam Type Vaginal    Palpation TTP with movement or any skin pulling    Strength weak squeeze, no lift    Strength # of reps --   10 quick   Strength # of seconds 20    Tone high              [facility] Adult PT Treatment/Exercise - 05/10/21 0001       Self-Care   Self-Care Other Self-Care Comments    Other Self-Care Comments  dilator information and use and reviewed using vaginal cream and coconut oil                     PT Education - 05/10/21 1520     Education Details educated on coconut oil and dilators    Person(s) Educated Patient    Methods Explanation;Demonstration;Tactile cues;Verbal cues;Handout    Comprehension Verbalized understanding;Returned demonstration                 PT Long Term Goals - 05/10/21 1517       PT LONG TERM GOAL #1   Title Pt will be comfortable with using dilators on her own to build up to intercourse with her husband    Time 12    Period Weeks    Status New    Target Date 08/02/21      PT LONG TERM GOAL #2   Title Pt will be ind with advanced HEP    Time 12    Period Weeks    Target Date 08/02/21      PT LONG TERM GOAL #3   Title Pt will report no more throbbing or pain in vagina    Baseline daily    Time 12    Period Weeks    Status New    Target Date 08/02/21  PT LONG TERM GOAL #4   Title Pt reports no leakage or sensation of leaking    Time 12    Period Weeks    Status New    Target Date 08/02/21                    Plan - 05/10/21 1659     Clinical Impression Statement Pt presents to clinic today due to ongoing issues that have recently exacerbated in the  last few weeks. Pt has been seen by pelvic floor PT at another clinic about a year ago for about 5 visits.  Pt has a lot of tension in lumbar, hamstrings, adductors. She has tight and elevated perineum and tight transverse perineal muscle.  Levators are tight bil but not TTP.  Tenderness happens with movement of gloved finge and seems to be the pulling on delicate skin.  Pt has been prescribed hormone cream and will be using that soon. Pt has 2/5 MMT of pelvic floor.  She is able to hold 20 seconds and relaxed after the sustained hold.  Pt had difficulty relaxing after quick flicks.  Pt has shortened muscle overall and was given info and educated about dilators.  She has slight trendelenburg SLS on Lt side.  Pt has ASLR that is improved with support to gluteals and was more difficulty with pelvic compression demonstrating increased tone in abdominal and decreased gluteal stability. Pt will benefit from skilled PT to address impairments including muscle imbalances and pain mangement.    Personal Factors and Comorbidities Time since onset of injury/illness/exacerbation    Examination-Activity Limitations Continence;Toileting    Stability/Clinical Decision Making Evolving/Moderate complexity    Clinical Decision Making Moderate    Rehab Potential Excellent    PT Frequency 1x / week    PT Duration 12 weeks    PT Treatment/Interventions ADLs/Self Care Home Management;Biofeedback;Passive range of motion;Dry needling;Manual techniques;Neuromuscular re-education;Therapeutic exercise;Therapeutic activities;Electrical Stimulation;Moist Heat;Cryotherapy;Patient/family education    PT Next Visit Plan lumbar STM/DN; review dilators, rectus abdominus and abdominal fascial release; stretch lumbar and thoracic and press ups for abdominal stretch with breathing into ribcage    PT Home Exercise Plan dilator vaginal trainer handout    Consulted and Agree with Plan of Care Patient             Patient will benefit  from skilled therapeutic intervention in order to improve the following deficits and impairments:  Pain, Postural dysfunction, Impaired flexibility, Decreased strength, Decreased coordination, Increased muscle spasms  Visit Diagnosis: Other muscle spasm  Muscle weakness (generalized)     Problem List Patient Active Problem List   Diagnosis Date Noted   Hemorrhoids 11/08/2016   Irritable bowel syndrome 03/17/2016   Constipation 03/17/2016   GERD (gastroesophageal reflux disease) 03/08/2015    Jule Ser, PT 05/10/2021, 5:27 PM  [facility] @ [address] [place], [place], [address] Phone: [phone]   Fax:  [phone]  Name: Laurie Griffith MRN: [id number] Date of Birth: April 11, 1960

## 2021-05-10 NOTE — Patient Instructions (Signed)
Vaginal trainers  Prior to Use:   Wash the vaginal trainer with soap and water before and after each use.   Use a water-soluble lubricant like Slippery Stuff or Surgulibe.   Avoid using Vaseline, coconut oil, or other oil-based lubricants. They are not water-soluble and can be irritating to the tissues in the vagina.   Do not use silicon-based lubricants with a silicon vaginal trainer. Using a siliconbased lubricant with a silicon device can contribute to break down of the material.  Setting Up Your Space   Work in a comfortable room lying on your back on a bed or couch with your knees bent and knees relaxed open. Use pillows underneath your knees as they are relaxed open and to support your upper back and head. Place a towel underneath your bottom to collect any lubricant.   Place your vaginal trainers and lubricant on a towel next to you within arm's reach for easy access.   Starting to use your trainer:  o Take 10-20 deep breaths to quiet your nervous system  o Perform stretches to help relax your hips and pelvic floor such as child's pose, cat/cow, or happy baby pose  Using Your Trainers   Coat the smallest vaginal trainer, or the size you are most comfortable using, with lubricant   Place the tip of the trainer at the opening to the vagina.   Take a few deep breaths to adjust to the sensation of the lubricant and trainer.   Slowly insert the rounded end of the trainer into your vaginal opening as far as you are comfortable. Pause and breathe if you experience pain, tension, or muscle guarding at any time. Once you feel comfortable gently slide trainer deeper into the vaginal canal as far as it will go without causing pain or discomfort.  Progressing with your Trainers   Gently press the trainer toward the bottom and sides of the vaginal opening to give it a gentle stretch. Pause and breathe at each spot and tension melts away.   Once fully inserted, turn the trainer clockwise and  counterclockwise to produce different sensations   Slowly move the trainer in and out as you breathe and focus on staying as relaxed as possible  To progress to the next size gradually, once one trainer is completely pain-free and comfortable to use, insert that smaller trainer first for 5-10 minutes and then follow with the next largest size trainer for 5-10 minutes. Gradually decrease the length of time using the smaller trainer as you increase the length of time using the larger trainer.   Move at your pace ad what's comfortable for you.  Wrapping up your session   Use trainers for 5-10 minutes every other day or 3 times a week   Wash and dry your vaginal trainers after use  Other considerations: Try to approach using vaginal trainers from a place of curiosity instead of judgment - what can my body do today, vs. why can't it do this, or I should be able to do this. Try letting go of that idea that it should be different, and try to meet yourself where you are at, without the pressure to change anything Do you bring your vaginal trainers into PT? Sometimes, bringing your trainers into sessions with your PT and having them talk you through the process while you are in control of the trainer can be helpful. Maybe they can help you find ways to make insertion a bit easier for you, or they can   help remind you to breathe. If you aren't doing this, I definitely recommend talking to a PT about it. Sometimes knowing the physical tools you have can help with the mental game. One thing that can be helpful to do before jumping to dilating is called "cupping". It is just taking your hand and holding your palm to your vulva and breathing. Doing this before doing any type of trainer training can be helpful as it lets you take a second and check in, vs jumping right in. Kind of like a warm up to your workout! Try different tools and see if you like another one more. Some of our patients prefer crystal wands or  plastic trainers to silicone, some prefer starting with a vibrating pelvic wand instead of a trainer, look at different options and see what interests you. You can also try different lubricants. And don't feel as though you need to jump right into inserting anything. The first few times (or minutes of the session) may just be about putting it at the entrance without inserting, and that's ok! We have also had patients find success with an external vibrator on their pubic bone while they use trainers as this can help distract nerves and increase muscle relaxation. This can be helpful to normalize the trainers. Leave the one you are currently using and the one you want to progress to somewhere you see it every day, like the bathroom counter or the bedside table. Seeing them every day can make them less intimidating. The more you do something, the more routine it becomes, so setting a vaginal trainers schedule and sticking to it can help make it less intimidating. Last thing that could be helpful to you is to set yourself up a relaxing environment when you use your vaginal trainers. Play your favorite calming music, light your favorite candle, incense, or turn on your diffuser, wear your coziest t-shirt and socks, prop your legs on pillows, anything that feels like a big exhale. Don't distract yourself with tv or a movie. Stay tuned into your body to help maintain relaxation Listening to relaxing music or meditations can also be helpful. Preferences for guided meditations can be so different from person to person so find one you feel relaxed/safe with!      Copyright 2023 [employer]   

## 2021-05-26 DIAGNOSIS — M6289 Other specified disorders of muscle: Secondary | ICD-10-CM | POA: Diagnosis not present

## 2021-05-26 DIAGNOSIS — R519 Headache, unspecified: Secondary | ICD-10-CM | POA: Diagnosis not present

## 2021-06-01 DIAGNOSIS — F411 Generalized anxiety disorder: Secondary | ICD-10-CM | POA: Diagnosis not present

## 2021-06-14 ENCOUNTER — Ambulatory Visit: Payer: BC Managed Care – PPO | Attending: Physician Assistant | Admitting: Physical Therapy

## 2021-06-14 ENCOUNTER — Other Ambulatory Visit: Payer: Self-pay

## 2021-06-14 DIAGNOSIS — M62838 Other muscle spasm: Secondary | ICD-10-CM | POA: Insufficient documentation

## 2021-06-14 DIAGNOSIS — M6281 Muscle weakness (generalized): Secondary | ICD-10-CM | POA: Diagnosis not present

## 2021-06-14 NOTE — Therapy (Signed)
[place] [facility] ?[facility] @ [place] ?[place], [place], [address] ?Phone: [phone]   Fax:  [phone] ? ?Physical Therapy Treatment ? ?Patient Details  ?Name: Laurie Griffith ?MRN: [id number] ?Date of Birth: June 06, 1960 ?Referring Provider (PT): Megan Salon, MD ? ? ?Encounter Date: 06/14/2021 ? ? PT End of Session - 06/14/21 1723   ? ? Visit Number 2   ? Date for PT Re-Evaluation 08/02/21   ? Authorization Type BCBS   ? PT Start Time 1615   ? PT Stop Time 1700   ? PT Time Calculation (min) 45 min   ? Activity Tolerance Patient tolerated treatment well   ? Behavior During Therapy [facility] for tasks assessed/performed   ? ?  ?  ? ?  ? ? ?Past Medical History:  ?Diagnosis Date  ? Cyst of left kidney 07/24/2014  ? simple appearing left mid kidney cyst  ? GERD (gastroesophageal reflux disease) 09/2011  ? Hiatal hernia   ? Internal hemorrhoids   ? Overactive bladder   ? Pericardial effusion 07/24/2014  ? trace anterior  ? Uterine fibroid 07/24/2014  ? 2.4 cm subserosal left fundal  ? ? ?Past Surgical History:  ?Procedure Laterality Date  ? HEMORRHOID BANDING    ? HYSTEROSCOPY  03/30/2008  ? resction of polyps and D&C  ? TONSILLECTOMY AND ADENOIDECTOMY  age 30  ? WISDOM TOOTH EXTRACTION  age 32  ? ? ?There were no vitals filed for this visit. ? ? Subjective Assessment - 06/14/21 1620   ? ? Subjective I am not seeing much progress with the dilators.  I also had a time where I woke up and had started to pee.   ? ?  ?  ? ?  ? ? ? ? ? ? ? ? ? ? ? ? ? ? ? ? ? ? ? ? [facility] Adult PT Treatment/Exercise - 06/14/21 0001   ? ?  ? Neuro Re-ed   ? Neuro Re-ed Details  breathing with stretching   ?  ? Exercises  ? Exercises Lumbar   ?  ? Lumbar Exercises: Stretches  ? Other Lumbar Stretch Exercise thoracic rotation, butterfly, happy baby - 2 min with breathing   ?  ? Manual Therapy  ? Manual Therapy Soft tissue mobilization   ? Soft tissue mobilization rectus abdominus; thoracic and lumbar  paraspinals   ? ?  ?  ? ?  ? ? ? ? ? ? ? ? ? ? ? ? ? ? ? PT Long Term Goals - 05/10/21 1517   ? ?  ? PT LONG TERM GOAL #1  ? Title Pt will be comfortable with using dilators on her own to build up to intercourse with her husband   ? Time 12   ? Period Weeks   ? Status New   ? Target Date 08/02/21   ?  ? PT LONG TERM GOAL #2  ? Title Pt will be ind with advanced HEP   ? Time 12   ? Period Weeks   ? Target Date 08/02/21   ?  ? PT LONG TERM GOAL #3  ? Title Pt will report no more throbbing or pain in vagina   ? Baseline daily   ? Time 12   ? Period Weeks   ? Status New   ? Target Date 08/02/21   ?  ? PT LONG TERM GOAL #4  ? Title Pt reports no leakage or sensation of leaking   ?  Time 12   ? Period Weeks   ? Status New   ? Target Date 08/02/21   ? ?  ?  ? ?  ? ? ? ? ? ? ? ? Plan - 06/14/21 1715   ? ? Clinical Impression Statement Today's session focused on improved flexilbility and stretches and breathing to do during dilator usage.  Pt did well with STM and got some release throughout thoracic and lumbar regions.  Stretches added to HEP to promote improved ROM.   ? PT Treatment/Interventions ADLs/Self Care Home Management;Biofeedback;Passive range of motion;Dry needling;Manual techniques;Neuromuscular re-education;Therapeutic exercise;Therapeutic activities;Electrical Stimulation;Moist Heat;Cryotherapy;Patient/family education   ? PT Next Visit Plan review stretches in HEP; internal STM if still not having progress with dilators, press up stretches, cat cow, child pose with threading, lying on foam roller   ? PT Home Exercise Plan dilator vaginal trainer handout   ? Consulted and Agree with Plan of Care Patient   ? ?  ?  ? ?  ? ? ?Patient will benefit from skilled therapeutic intervention in order to improve the following deficits and impairments:  Pain, Postural dysfunction, Impaired flexibility, Decreased strength, Decreased coordination, Increased muscle spasms ? ?Visit Diagnosis: ?Other muscle spasm ? ?Muscle  weakness (generalized) ? ? ? ? ?Problem List ?Patient Active Problem List  ? Diagnosis Date Noted  ? Hemorrhoids 11/08/2016  ? Irritable bowel syndrome 03/17/2016  ? Constipation 03/17/2016  ? GERD (gastroesophageal reflux disease) 03/08/2015  ? ? ?Jule Ser, PT ?06/14/2021, 5:24 PM ? ?[facility] ?[facility] @ [place] ?[place], [place], [address] ?Phone: [phone]   Fax:  [phone] ? ?Name: Laurie Griffith ?MRN: [id number] ?Date of Birth: April 12, 1960 ? ? ? ?

## 2021-06-23 ENCOUNTER — Other Ambulatory Visit: Payer: Self-pay

## 2021-06-23 ENCOUNTER — Encounter: Payer: Self-pay | Admitting: Physical Therapy

## 2021-06-23 ENCOUNTER — Ambulatory Visit: Payer: BC Managed Care – PPO | Admitting: Physical Therapy

## 2021-06-23 DIAGNOSIS — M6281 Muscle weakness (generalized): Secondary | ICD-10-CM | POA: Diagnosis not present

## 2021-06-23 DIAGNOSIS — M62838 Other muscle spasm: Secondary | ICD-10-CM | POA: Diagnosis not present

## 2021-06-23 NOTE — Therapy (Signed)
[place] ?[facility] ?[place], [place], [address] ?Phone: [phone]   Fax:  [phone] ? ?Physical Therapy Treatment ? ?Patient Details  ?Name: Laurie Griffith ?MRN: [id number] ?Date of Birth: Aug 03, 1960 ?Referring Provider (PT): Megan Salon, MD ? ? ?Encounter Date: 06/23/2021 ? ? PT End of Session - 06/23/21 1456   ? ? Visit Number 3   ? Date for PT Re-Evaluation 08/02/21   ? Authorization Type BCBS   ? PT Start Time 1451   ? PT Stop Time 1529   ? PT Time Calculation (min) 38 min   ? Activity Tolerance Patient tolerated treatment well   ? Behavior During Therapy [facility] for tasks assessed/performed   ? ?  ?  ? ?  ? ? ?Past Medical History:  ?Diagnosis Date  ? Cyst of left kidney 07/24/2014  ? simple appearing left mid kidney cyst  ? GERD (gastroesophageal reflux disease) 09/2011  ? Hiatal hernia   ? Internal hemorrhoids   ? Overactive bladder   ? Pericardial effusion 07/24/2014  ? trace anterior  ? Uterine fibroid 07/24/2014  ? 2.4 cm subserosal left fundal  ? ? ?Past Surgical History:  ?Procedure Laterality Date  ? HEMORRHOID BANDING    ? HYSTEROSCOPY  03/30/2008  ? resction of polyps and D&C  ? TONSILLECTOMY AND ADENOIDECTOMY  age 15  ? WISDOM TOOTH EXTRACTION  age 43  ? ? ?There were no vitals filed for this visit. ? ? Subjective Assessment - 06/23/21 1704   ? ? Subjective I was able to use the larger dilator but it still hurts.   ? Patient Stated Goals stop having the pulsing and not leakage   ? Currently in Pain? No/denies   ? ?  ?  ? ?  ? ? ? ? ? ? ? ? ? ? ? ? ? ? ? ? ? ? ? ? [facility] Adult PT Treatment/Exercise - 06/23/21 0001   ? ?  ? Self-Care  ? Other Self-Care Comments  reviewed dilators and using estrogen cream   ?  ? Manual Therapy  ? Manual Therapy Internal Pelvic Floor   ? Internal Pelvic Floor bilat levators; fascial release vaginal canal Lt>Rt   ? ?  ?  ? ?  ? ? ? ? ? ? ? ? ? ? ? ? ? ? ? PT Long Term Goals - 06/23/21 1710   ? ?  ? PT LONG TERM  GOAL #1  ? Title Pt will be comfortable with using dilators on her own to build up to intercourse with her husband   ? Status On-going   ?  ? PT LONG TERM GOAL #2  ? Title Pt will be ind with advanced HEP   ? Status On-going   ?  ? PT LONG TERM GOAL #3  ? Title Pt will report no more throbbing or pain in vagina   ? Baseline getting better   ? Status On-going   ?  ? PT LONG TERM GOAL #4  ? Title Pt reports no leakage or sensation of leaking   ? Baseline getting better   ? Status On-going   ? ?  ?  ? ?  ? ? ? ? ? ? ? ? Plan - 06/23/21 1705   ? ? Clinical Impression Statement Today's session focused on soft tissue release.  Internal releases performed with pt consent and identity confirmed.  Pt was most tender to skin and fascia on  the left just around the vaginal canal more superficially.  Pt was educated on trying the estrogen cream that she was prescribed as this can help [place] get more into the muscle tension.  Pt did have some release of tension today.  pt will benefit from skilled PT to continue to progress with imporved spine and soft tissue mobilty.   ? PT Treatment/Interventions ADLs/Self Care Home Management;Biofeedback;Passive range of motion;Dry needling;Manual techniques;Neuromuscular re-education;Therapeutic exercise;Therapeutic activities;Electrical Stimulation;Moist Heat;Cryotherapy;Patient/family education   ? PT Next Visit Plan internal STM if still not having progress with dilators, press up stretches, cat cow, child pose with threading, lying on foam roller; thoracic and lumbar maybe DN   ? Consulted and Agree with Plan of Care Patient   ? ?  ?  ? ?  ? ? ?Patient will benefit from skilled therapeutic intervention in order to improve the following deficits and impairments:  Pain, Postural dysfunction, Impaired flexibility, Decreased strength, Decreased coordination, Increased muscle spasms ? ?Visit Diagnosis: ?Other muscle spasm ? ?Muscle weakness (generalized) ? ? ? ? ?Problem List ?Patient Active  Problem List  ? Diagnosis Date Noted  ? Hemorrhoids 11/08/2016  ? Irritable bowel syndrome 03/17/2016  ? Constipation 03/17/2016  ? GERD (gastroesophageal reflux disease) 03/08/2015  ? ? ?Laurie Griffith, PT ?06/23/2021, 5:14 PM ? ?[place] ?[facility] ?[place], [place], [address] ?Phone: [phone]   Fax:  [phone] ? ?Name: Laurie Griffith ?MRN: [id number] ?Date of Birth: 1960-10-11 ? ? ? ?

## 2021-06-28 ENCOUNTER — Encounter: Payer: No Typology Code available for payment source | Admitting: Physical Therapy

## 2021-07-04 ENCOUNTER — Ambulatory Visit: Payer: BC Managed Care – PPO | Admitting: Physical Therapy

## 2021-07-04 ENCOUNTER — Encounter: Payer: Self-pay | Admitting: Physical Therapy

## 2021-07-04 ENCOUNTER — Other Ambulatory Visit: Payer: Self-pay

## 2021-07-04 DIAGNOSIS — M6281 Muscle weakness (generalized): Secondary | ICD-10-CM

## 2021-07-04 DIAGNOSIS — M62838 Other muscle spasm: Secondary | ICD-10-CM

## 2021-07-04 DIAGNOSIS — F411 Generalized anxiety disorder: Secondary | ICD-10-CM | POA: Diagnosis not present

## 2021-07-04 NOTE — Therapy (Signed)
[place] ?[facility] ?[place], [place], [address] ?Phone: [phone]   Fax:  [phone] ? ?Physical Therapy Treatment ? ?Patient Details  ?Name: Laurie Griffith ?MRN: [id number] ?Date of Birth: [birth year] ?Referring Provider (PT): Megan Salon, MD ? ? ?Encounter Date: 07/04/2021 ? ? PT End of Session - 07/04/21 1405   ? ? Visit Number 4   ? Date for PT Re-Evaluation 08/02/21   ? Authorization Type BCBS   ? PT Start Time 9211   ? PT Stop Time 9417   ? PT Time Calculation (min) 40 min   ? Activity Tolerance Patient tolerated treatment well   ? Behavior During Therapy [facility] for tasks assessed/performed   ? ?  ?  ? ?  ? ? ?Past Medical History:  ?Diagnosis Date  ? Cyst of left kidney 07/24/2014  ? simple appearing left mid kidney cyst  ? GERD (gastroesophageal reflux disease) 09/2011  ? Hiatal hernia   ? Internal hemorrhoids   ? Overactive bladder   ? Pericardial effusion 07/24/2014  ? trace anterior  ? Uterine fibroid 07/24/2014  ? 2.4 cm subserosal left fundal  ? ? ?Past Surgical History:  ?Procedure Laterality Date  ? HEMORRHOID BANDING    ? HYSTEROSCOPY  03/30/2008  ? resction of polyps and D&C  ? TONSILLECTOMY AND ADENOIDECTOMY  age 74  ? WISDOM TOOTH EXTRACTION  age 70  ? ? ?There were no vitals filed for this visit. ? ? ? ? ? ? ? ? ? ? ? ? ? ? ? ? ? ? ? ? ? [facility] Adult PT Treatment/Exercise - 07/04/21 0001   ? ?  ? Neuro Re-ed   ? Neuro Re-ed Details  activation of TrA with exercises   ?  ? Lumbar Exercises: Supine  ? Other Supine Lumbar Exercises bent knee fall out and march 10x ; bridge 20x   ?  ? Manual Therapy  ? Manual Therapy Internal Pelvic Floor   ? Manual therapy comments pt informed and internal soft tissue given consent and identity confirmed   ? Internal Pelvic Floor bilat levators; fascial release vaginal canal Lt>Rt   ? ?  ?  ? ?  ? ? ? [place] Dry Needling - 07/04/21 0001   ? ? Consent Given? Yes   ? Education Handout Provided  Previously provided   had before  ? Muscles Treated Back/Hip Lumbar multifidi;Thoracic multifidi   ? Lumbar multifidi Response Twitch response elicited;Palpable increased muscle length   ? Thoracic multifidi response Twitch response elicited;Palpable increased muscle length   ? ?  ?  ? ?  ? ? ? ? ? ? ? ? PT Education - 07/04/21 1441   ? ? Education Details Access Code: [email or web address] and using moisturizer around indroitus   ? Person(s) Educated Patient   ? Methods Explanation;Demonstration;Tactile cues;Verbal cues;Handout   ? Comprehension Returned demonstration;Verbalized understanding   ? ?  ?  ? ?  ? ? ? ? ? ? PT Long Term Goals - 06/23/21 1710   ? ?  ? PT LONG TERM GOAL #1  ? Title Pt will be comfortable with using dilators on her own to build up to intercourse with her husband   ? Status On-going   ?  ? PT LONG TERM GOAL #2  ? Title Pt will be ind with advanced HEP   ? Status On-going   ?  ? PT LONG TERM GOAL #3  ? Title Pt  will report no more throbbing or pain in vagina   ? Baseline getting better   ? Status On-going   ?  ? PT LONG TERM GOAL #4  ? Title Pt reports no leakage or sensation of leaking   ? Baseline getting better   ? Status On-going   ? ?  ?  ? ?  ? ? ? ? ? ? ? ? Plan - 07/04/21 1408   ? ? Clinical Impression Statement Today's session focused on releases muscle tension throughout the core and back.  Pt was given stretches to continue to address soft tissue lengthening at home. Pt has made some progress with being able to insert the hormone medication without pain.  Continue per plan of care   ? PT Treatment/Interventions ADLs/Self Care Home Management;Biofeedback;Passive range of motion;Dry needling;Manual techniques;Neuromuscular re-education;Therapeutic exercise;Therapeutic activities;Electrical Stimulation;Moist Heat;Cryotherapy;Patient/family education   ? Consulted and Agree with Plan of Care Patient   ? ?  ?  ? ?  ? ? ?Patient will benefit from skilled therapeutic intervention in order to improve  the following deficits and impairments:  Pain, Postural dysfunction, Impaired flexibility, Decreased strength, Decreased coordination, Increased muscle spasms ? ?Visit Diagnosis: ?Other muscle spasm ? ?Muscle weakness (generalized) ? ? ? ? ?Problem List ?Patient Active Problem List  ? Diagnosis Date Noted  ? Hemorrhoids 11/08/2016  ? Irritable bowel syndrome 03/17/2016  ? Constipation 03/17/2016  ? GERD (gastroesophageal reflux disease) 03/08/2015  ? ? ?Jule Ser, PT ?07/04/2021, 2:47 PM ? ?[place] ?[facility] ?[place], [place], [address] ?Phone: [phone]   Fax:  [phone] ? ?Name: Laurie Griffith ?MRN: [id number] ?Date of Birth: 1960/09/30 ? ? ? ?

## 2021-07-04 NOTE — Patient Instructions (Signed)
Access Code: [email or web address] ?URL: [email or web address] ?Date: 07/04/2021 ?Prepared by: Jari Favre ? ?Exercises ?- Sidelying Thoracic Lumbar Rotation  - 1 x daily - 7 x weekly - 1 sets - 5 reps - 10 sec hold ?- Seated Thoracic Flexion and Rotation with Arms Crossed  - 1 x daily - 7 x weekly - 1 sets - 10 reps - 5 sec hold ?- Seated Thoracic Extension and Rotation with Reach  - 1 x daily - 7 x weekly - 3 sets - 10 reps ?- Supine Butterfly Groin Stretch  - 1 x daily - 7 x weekly - 1 sets - 3 reps - 30 sec hold ?- Supine Pelvic Floor Stretch - Hands on Knees  - 1 x daily - 7 x weekly - 1 sets - 3 reps - 30 hold ?

## 2021-07-21 ENCOUNTER — Encounter: Payer: Self-pay | Admitting: Internal Medicine

## 2021-07-21 ENCOUNTER — Other Ambulatory Visit: Payer: Self-pay

## 2021-07-21 ENCOUNTER — Ambulatory Visit (AMBULATORY_SURGERY_CENTER): Payer: Self-pay | Admitting: *Deleted

## 2021-07-21 VITALS — Ht 63.5 in | Wt 97.0 lb

## 2021-07-21 DIAGNOSIS — Z1211 Encounter for screening for malignant neoplasm of colon: Secondary | ICD-10-CM

## 2021-07-21 MED ORDER — NA SULFATE-K SULFATE-MG SULF 17.5-3.13-1.6 GM/177ML PO SOLN
1.0000 | Freq: Once | ORAL | 0 refills | Status: AC
Start: 2021-07-21 — End: 2021-07-21

## 2021-07-21 NOTE — Progress Notes (Signed)
No egg or soy allergy known to patient  ?No issues known to pt with past sedation with any surgeries or procedures ?Patient denies ever being told they had issues or difficulty with intubation  ?No FH of Malignant Hyperthermia ?Pt is not on diet pills ?Pt is not on  home 02  ?Pt is not on blood thinners  ?Pt denies issues with constipation  ?No A fib or A flutter ? ?SUPREP. Coupon to pt in PV today , Code to Pharmacy and  NO PA's for preps discussed with pt In PV today  ?Discussed with pt there will be an out-of-pocket cost for prep and that varies from $0 to 70 +  dollars - pt verbalized understanding  ?Pt instructed to use Singlecare.com or GoodRx for a price reduction on prep  ? ?PV completed over the phone. Pt verified name, DOB, address and insurance during PV today.  ?Pt mailed instruction packet with copy of consent form to read and not return, and instructions.  ?Pt encouraged to call with questions or issues.  ?If pt has My chart, procedure instructions sent via My Chart  ? ?At the end of pre-visit call, pt stated she was not going to be able to drink all the fluid required for the suprep,requested different prep and went over different ones that could be offered to her,she requested that the miralax prep instructions be sent to my chart along with the suprep and she will decide on the one that would be best for her body,instructions sent per pt. Request.  ?

## 2021-07-22 ENCOUNTER — Telehealth: Payer: Self-pay | Admitting: Internal Medicine

## 2021-07-22 DIAGNOSIS — Z1211 Encounter for screening for malignant neoplasm of colon: Secondary | ICD-10-CM

## 2021-07-22 MED ORDER — NA SULFATE-K SULFATE-MG SULF 17.5-3.13-1.6 GM/177ML PO SOLN
1.0000 | Freq: Once | ORAL | 0 refills | Status: AC
Start: 2021-07-22 — End: 2021-07-22

## 2021-07-22 NOTE — Telephone Encounter (Signed)
Suprep sent [facility] [address] of [address] as pt requested ? ?Called pt and notified sent in and to use the [place].[employer] app for a [facility] generic Sup[facility] coupon - lm for pt  ? ? ?

## 2021-07-22 NOTE — Telephone Encounter (Signed)
Patient called stating the medication sent to [facility] was a lot more expensive than she expected.  She did some comparison pricing and found that [facility] the [facility] of [address] was significantly less expensive.  She is asking that you cancel the prescription to [facility] and [facility] the [facility] her [facility].  If you could please call her once the order has been placed so she can go pick it up.  Thank you. ?

## 2021-07-26 ENCOUNTER — Encounter: Payer: Self-pay | Admitting: Physical Therapy

## 2021-07-26 ENCOUNTER — Ambulatory Visit: Payer: BC Managed Care – PPO | Attending: Obstetrics & Gynecology | Admitting: Physical Therapy

## 2021-07-26 DIAGNOSIS — M62838 Other muscle spasm: Secondary | ICD-10-CM | POA: Insufficient documentation

## 2021-07-26 DIAGNOSIS — M6281 Muscle weakness (generalized): Secondary | ICD-10-CM | POA: Diagnosis not present

## 2021-07-26 NOTE — Therapy (Addendum)
[place] ?[facility] ?[place], [place], [address] ?Phone: [phone]   Fax:  [phone] ? ?Physical Therapy Treatment ? ?Patient Details  ?Name: LOANNE EMERY ?MRN: [id number] ?Date of Birth: [birth year] ?Referring Provider (PT): Megan Salon, MD ? ? ?Encounter Date: 07/26/2021 ? ? PT End of Session - 07/26/21 1155   ? ? Visit Number 5   ? Date for PT Re-Evaluation 08/02/21   ? Authorization Type BCBS   ? PT Start Time 1149   ? PT Stop Time 1230   ? PT Time Calculation (min) 41 min   ? Activity Tolerance Patient tolerated treatment well   ? Behavior During Therapy [facility] for tasks assessed/performed   ? ?  ?  ? ?  ? ? ?Past Medical History:  ?Diagnosis Date  ? Arthritis   ? EARLY ARTHRITIS IN NECK  ? Cyst of left kidney 07/24/2014  ? simple appearing left mid kidney cyst  ? GERD (gastroesophageal reflux disease) 09/2011  ? Hiatal hernia   ? Hyperlipidemia   ? Internal hemorrhoids   ? Overactive bladder   ? Pericardial effusion 07/24/2014  ? trace anterior  ? Uterine fibroid 07/24/2014  ? 2.4 cm subserosal left fundal  ? ? ?Past Surgical History:  ?Procedure Laterality Date  ? COLONOSCOPY    ? HEMORRHOID BANDING    ? HYSTEROSCOPY  03/30/2008  ? resction of polyps and D&C  ? TONSILLECTOMY AND ADENOIDECTOMY  age 30  ? WISDOM TOOTH EXTRACTION  age 45  ? ? ?There were no vitals filed for this visit. ? ? Subjective Assessment - 07/26/21 1156   ? ? Subjective I had the urethra pain and pulsing that lasted 4 year.  My stomach has been sensative for 8 years with everything going on and wondering if that is part of it   ? Patient Stated Goals stop having the pulsing and not leakage   ? Currently in Pain? No/denies   ? ?  ?  ? ?  ? ? ? ? ? ? ? ? ? ? ? ? ? ? ? ? ? ? ? ? [facility] Adult PT Treatment/Exercise - 07/26/21 0001   ? ?  ? Self-Care  ? Other Self-Care Comments  answering questions about HEP and dilators   ?  ? Manual Therapy  ? Manual Therapy Myofascial release   ?  Myofascial Release stomach and colon and liver releases   ? ?  ?  ? ?  ? ? ? ? ? ? ? ? ? ? ? ? ? ? ? PT Long Term Goals - 07/26/21 2117   ? ?  ? PT LONG TERM GOAL #1  ? Title Pt will be comfortable with using dilators on her own to build up to intercourse with her husband   ? Status Partially Met   ?  ? PT LONG TERM GOAL #2  ? Title Pt will be ind with advanced HEP   ? Status Partially Met   ?  ? PT LONG TERM GOAL #3  ? Title Pt will report no more throbbing or pain in vagina   ? Baseline had one time that lasted 4 hours and that was the first time for a while   ? Status Partially Met   ?  ? PT LONG TERM GOAL #4  ? Title Pt reports no leakage or sensation of leaking   ? Baseline no leakage or sensation of that, just throbbing feels like  I have to pee   ? Status Achieved   ? ?  ?  ? ?  ? ? ? ? ? ? ? ? Plan - 07/26/21 2031   ? ? Clinical Impression Statement Today's session focused  on review of how to continue with HEP and dliators at home.  Used time during treatment to assess and treat visceral fascia and got good release around stomach and hiatal region. Pt will do HEP and use dilators and call for further appointments within the next 2-3 weeks if she is unable to progress on her own.  skilled PT is still recommended to ensure successful transition to independence with HEP   ? PT Treatment/Interventions ADLs/Self Care Home Management;Biofeedback;Passive range of motion;Dry needling;Manual techniques;Neuromuscular re-education;Therapeutic exercise;Therapeutic activities;Electrical Stimulation;Moist Heat;Cryotherapy;Patient/family education   ? PT Next Visit Plan review HEP and f/u on MFR to abdomen   ? PT Home Exercise Plan dilator vaginal trainer handout   ? Consulted and Agree with Plan of Care Patient   ? ?  ?  ? ?  ? ? ?Patient will benefit from skilled therapeutic intervention in order to improve the following deficits and impairments:  Pain, Postural dysfunction, Impaired flexibility, Decreased strength,  Decreased coordination, Increased muscle spasms ? ?Visit Diagnosis: ?Other muscle spasm ? ?Muscle weakness (generalized) ? ? ? ? ?Problem List ?Patient Active Problem List  ? Diagnosis Date Noted  ? Hemorrhoids 11/08/2016  ? Irritable bowel syndrome 03/17/2016  ? Constipation 03/17/2016  ? GERD (gastroesophageal reflux disease) 03/08/2015  ? ? ?Jule Ser, PT ?07/26/2021, 9:19 PM ? ? ?[facility] ?[place], [place], [address] ?Phone: [phone]   Fax:  [phone] ? ?Name: ARANTXA PIERCEY ?MRN: [id number] ?Date of Birth: 12/14/1960 ? ?PHYSICAL THERAPY DISCHARGE SUMMARY ? ?Visits from Start of Care: 5 ? ?Current functional level related to goals / functional outcomes: ? ? See above details ?Remaining deficits: ?See above details ?  ?Education / Equipment: ?HEP  ? ?Patient agrees to discharge. Patient goals were met. Patient is being discharged due to not returning since the last visit. ? ?Pt did not return as discussed at last session, she would return within one month if needed.  Pt was feeling she was able to manage on her own at this time ? ?Gustavus Bryant, PT ?08/24/21 10:00 AM ? ? ? ?

## 2021-07-28 ENCOUNTER — Encounter: Payer: Self-pay | Admitting: Internal Medicine

## 2021-07-28 ENCOUNTER — Ambulatory Visit (AMBULATORY_SURGERY_CENTER): Payer: BC Managed Care – PPO | Admitting: Internal Medicine

## 2021-07-28 VITALS — BP 117/75 | HR 61 | Temp 97.5°F | Resp 13 | Ht 63.0 in | Wt 97.0 lb

## 2021-07-28 DIAGNOSIS — K635 Polyp of colon: Secondary | ICD-10-CM | POA: Diagnosis not present

## 2021-07-28 DIAGNOSIS — Z1211 Encounter for screening for malignant neoplasm of colon: Secondary | ICD-10-CM | POA: Diagnosis not present

## 2021-07-28 DIAGNOSIS — D125 Benign neoplasm of sigmoid colon: Secondary | ICD-10-CM

## 2021-07-28 MED ORDER — SODIUM CHLORIDE 0.9 % IV SOLN: 500.0000 mL | Freq: Once | INTRAVENOUS | Status: DC

## 2021-07-28 NOTE — Progress Notes (Signed)
Pt's states no medical or surgical changes since previsit or office visit. 

## 2021-07-28 NOTE — Progress Notes (Signed)
A/ox3, pleased with MAC, report to RN 

## 2021-07-28 NOTE — Patient Instructions (Signed)
HANDOUTS PROVIDED ON: POLYPS & HEMORRHOIDS ? ?The polyp removed today have been sent for pathology.  The results can take 1-3 weeks to receive.  When your next colonoscopy should occur will be based on the pathology results.   ? ?You may resume your previous diet and medication schedule. ? ?Thank you for allowing [place] to care for you today!!! ? ?YOU HAD AN ENDOSCOPIC PROCEDURE TODAY AT [facility]:   Refer to the procedure report that was given to you for any specific questions about what was found during the examination.  If the procedure report does not answer your questions, please call your gastroenterologist to clarify.  If you requested that your care partner not be given the details of your procedure findings, then the procedure report has been included in a sealed envelope for you to review at your convenience later. ? ?YOU SHOULD EXPECT: Some feelings of bloating in the abdomen. Passage of more gas than usual.  Walking can help get rid of the air that was put into your GI tract during the procedure and reduce the bloating. If you had a lower endoscopy (such as a colonoscopy or flexible sigmoidoscopy) you may notice spotting of blood in your stool or on the toilet paper. If you underwent a bowel prep for your procedure, you may not have a normal bowel movement for a few days. ? ?Please Note:  You might notice some irritation and congestion in your nose or some drainage.  This is from the oxygen used during your procedure.  There is no need for concern and it should clear up in a day or so. ? ?SYMPTOMS TO REPORT IMMEDIATELY: ? ?Following lower endoscopy (colonoscopy or flexible sigmoidoscopy): ? Excessive amounts of blood in the stool ? Significant tenderness or worsening of abdominal pains ? Swelling of the abdomen that is new, acute ? Fever of 100?F or higher ? ?For urgent or emergent issues, a gastroenterologist can be reached at any hour by calling [phone]. ?Do not use MyChart  messaging for urgent concerns.  ? ? ?DIET:  We do recommend a small meal at first, but then you may proceed to your regular diet.  Drink plenty of fluids but you should avoid alcoholic beverages for 24 hours. ? ?ACTIVITY:  You should plan to take it easy for the rest of today and you should NOT DRIVE or use heavy machinery until tomorrow (because of the sedation medicines used during the test).   ? ?FOLLOW UP: ?Our staff will call the number listed on your records Monday morning between 7:15 am and 8:15 am following your procedure to check on you and address any questions or concerns that you may have regarding the information given to you following your procedure. If we do not reach you, we will leave a message.  We will attempt to reach you two times.  During this call, we will ask if you have developed any symptoms of COVID 19. If you develop any symptoms (ie: fever, flu-like symptoms, shortness of breath, cough etc.) before then, please call [phone].  If you test positive for Covid 19 in the 2 weeks post procedure, please call and report this information to [place].   ? ?If any biopsies were taken you will be contacted by phone or by letter within the next 1-3 weeks.  Please call us at [phone] if you have not heard about the biopsies in 3 weeks.  ? ? ?SIGNATURES/CONFIDENTIALITY: ?You and/or your care partner have signed  paperwork which will be entered into your electronic medical record.  These signatures attest to the fact that that the information above on your After Visit Summary has been reviewed and is understood.  Full responsibility of the confidentiality of this discharge information lies with you and/or your care-partner. ? ? ? ? ?

## 2021-07-28 NOTE — Op Note (Signed)
[place] ?Patient Name: Laurie Griffith ?Procedure Date: 07/28/2021 7:33 AM ?MRN: [id number] ?Endoscopist: Jerene Bears , MD ?Age: 61 ?Referring MD:  ?Date of Birth: Sep 12, 1960 ?Gender: Female ?Account #: [id number] ?Procedure:                Colonoscopy ?Indications:              Screening for colorectal malignant neoplasm, Last  ?                          colonoscopy: 2013 ?Medicines:                Monitored Anesthesia Care ?Procedure:                Pre-Anesthesia Assessment: ?                          - Prior to the procedure, a History and Physical  ?                          was performed, and patient medications and  ?                          allergies were reviewed. The patient's tolerance of  ?                          previous anesthesia was also reviewed. The risks  ?                          and benefits of the procedure and the sedation  ?                          options and risks were discussed with the patient.  ?                          All questions were answered, and informed consent  ?                          was obtained. Prior Anticoagulants: The patient has  ?                          taken no previous anticoagulant or antiplatelet  ?                          agents. ASA Grade Assessment: II - A patient with  ?                          mild systemic disease. After reviewing the risks  ?                          and benefits, the patient was deemed in  ?                          satisfactory condition to undergo the procedure. ?  After obtaining informed consent, the colonoscope  ?                          was passed under direct vision. Throughout the  ?                          procedure, the patient's blood pressure, pulse, and  ?                          oxygen saturations were monitored continuously. The  ?                          Olympus PCF-H190DL (#[id number]) Colonoscope was  ?                          introduced through the anus and advanced to the   ?                          cecum, identified by appendiceal orifice and  ?                          ileocecal valve. The colonoscopy was performed  ?                          without difficulty. The patient tolerated the  ?                          procedure well. The quality of the bowel  ?                          preparation was good. The ileocecal valve,  ?                          appendiceal orifice, and rectum were photographed. ?Scope In: 8:18:06 AM ?Scope Out: 8:40:44 AM ?Scope Withdrawal Time: 0 hours 16 minutes 5 seconds  ?Total Procedure Duration: 0 hours 22 minutes 38 seconds  ?Findings:                 Hemorrhoids were found on perianal exam. ?                          A 3 mm polyp was found in the sigmoid colon. The  ?                          polyp was sessile. The polyp was removed with a  ?                          cold snare. Resection and retrieval were complete. ?                          External and internal hemorrhoids were found during  ?                          retroflexion (including post-banding scars) and  ?  during digital exam. The hemorrhoids were  ?                          small-sized. ?                          The exam was otherwise without abnormality. ?Complications:            No immediate complications. ?Estimated Blood Loss:     Estimated blood loss was minimal. ?Impression:               - One 3 mm polyp in the sigmoid colon, removed with  ?                          a cold snare. Resected and retrieved. ?                          - External and internal hemorrhoids. ?                          - The examination was otherwise normal. ?Recommendation:           - Patient has a contact number available for  ?                          emergencies. The signs and symptoms of potential  ?                          delayed complications were discussed with the  ?                          patient. Return to normal activities tomorrow.  ?                           Written discharge instructions were provided to the  ?                          patient. ?                          - Resume previous diet. ?                          - Continue present medications. ?                          - Await pathology results. ?                          - Repeat colonoscopy is recommended. The  ?                          colonoscopy date will be determined after pathology  ?                          results from today's exam become available for  ?  review. ?Jerene Bears, MD ?07/28/2021 8:43:12 AM ?This report has been signed electronically. ?

## 2021-07-28 NOTE — Progress Notes (Signed)
? ?GASTROENTEROLOGY PROCEDURE H&P NOTE  ? ?Primary Care Physician: ?Michael Boston, MD ? ? ? ?Reason for Procedure:  Colon cancer screening ? ?Plan:    Colonoscopy ? ?Patient is appropriate for endoscopic procedure(s) in the ambulatory ([place]) setting. ? ?The nature of the procedure, as well as the risks, benefits, and alternatives were carefully and thoroughly reviewed with the patient. Ample time for discussion and questions allowed. The patient understood, was satisfied, and agreed to proceed.  ? ? ? ?HPI: ?Laurie Griffith is a 61 y.o. female who presents for screening for colorectal cancer.  Medical history as below.  Tolerated the prep.  No recent chest pain or shortness of breath.  No abdominal pain today. ? ?Past Medical History:  ?Diagnosis Date  ? Arthritis   ? EARLY ARTHRITIS IN NECK  ? Cyst of left kidney 07/24/2014  ? simple appearing left mid kidney cyst  ? GERD (gastroesophageal reflux disease) 09/2011  ? Hiatal hernia   ? Hyperlipidemia   ? Internal hemorrhoids   ? Overactive bladder   ? Pericardial effusion 07/24/2014  ? trace anterior  ? Uterine fibroid 07/24/2014  ? 2.4 cm subserosal left fundal  ? ? ?Past Surgical History:  ?Procedure Laterality Date  ? COLONOSCOPY    ? HEMORRHOID BANDING    ? HYSTEROSCOPY  03/30/2008  ? resction of polyps and D&C  ? TONSILLECTOMY AND ADENOIDECTOMY  age 77  ? WISDOM TOOTH EXTRACTION  age 81  ? ? ?Prior to Admission medications   ?Medication Sig Start Date End Date Taking? Authorizing Provider  ?carboxymethylcellulose (REFRESH PLUS) 0.5 % SOLN 1 drop 2 (two) times daily as needed.    Yes [provider]  ?estradiol (ESTRACE) 0.1 MG/GM vaginal cream Place 0.5 g vaginally 2 (two) times a week. Place 0.5g nightly for two weeks then twice a week after 12/02/20  Yes Jaquita Folds, MD  ?Eyelid Cleansers (AVENOVA) 0.01 % SOLN Apply topically 2 (two) times daily.   Yes [provider]  ?Wheat Dextrin (BENEFIBER PO) Take by mouth. 2 Tablespoons daily    Yes [provider]  ?lidocaine (GLYDO) 2 % jelly Apply 1 application topically as needed. ?Patient not taking: Reported on 01/28/2021 12/01/20   Jaquita Folds, MD  ?phenylephrine-shark liver oil-mineral oil-petrolatum (PREPARATION H) 0.25-14-74.9 % rectal ointment Place 1 application rectally 2 (two) times daily as needed for hemorrhoids. ?Patient not taking: Reported on 01/28/2021    [provider]  ?Verlee Monte (PREPARATION H TOTABLES WIPES EX) Apply topically. ?Patient not taking: Reported on 01/28/2021    [provider]  ? ? ?Current Outpatient Medications  ?Medication Sig Dispense Refill  ? carboxymethylcellulose (REFRESH PLUS) 0.5 % SOLN 1 drop 2 (two) times daily as needed.     ? estradiol (ESTRACE) 0.1 MG/GM vaginal cream Place 0.5 g vaginally 2 (two) times a week. Place 0.5g nightly for two weeks then twice a week after 30 g 11  ? Eyelid Cleansers (AVENOVA) 0.01 % SOLN Apply topically 2 (two) times daily.    ? Wheat Dextrin (BENEFIBER PO) Take by mouth. 2 Tablespoons daily    ? lidocaine (GLYDO) 2 % jelly Apply 1 application topically as needed. (Patient not taking: Reported on 01/28/2021) 20 mL 5  ? phenylephrine-shark liver oil-mineral oil-petrolatum (PREPARATION H) 0.25-14-74.9 % rectal ointment Place 1 application rectally 2 (two) times daily as needed for hemorrhoids. (Patient not taking: Reported on 01/28/2021)    ? Witch Hazel (PREPARATION H TOTABLES WIPES EX) Apply topically. (  Patient not taking: Reported on 01/28/2021)    ? ?Current Facility-Administered Medications  ?Medication Dose Route Frequency Provider Last Rate Last Admin  ? 0.9 %  sodium chloride infusion  500 mL Intravenous Once Dwayne Begay, Lajuan Lines, MD      ? ? ?Allergies as of 07/28/2021 - Review Complete 07/28/2021  ?Allergen Reaction Noted  ? Doxycycline Nausea Only 12/31/2015  ? Food  11/09/1995  ? Nickel Other (See Comments) 07/28/2021  ? Penicillins Rash 02/06/2012  ? Sulfa antibiotics Rash 11/12/2011   ? Sulfasalazine Rash 11/12/2011  ? ? ?Family History  ?Problem Relation Age of Onset  ? Atrial fibrillation Father   ? Diabetes Father   ? Heart disease Brother   ? Breast cancer Paternal Aunt   ? Colon cancer Maternal Grandmother 49  ? Lung cancer Maternal Grandmother 65  ? Brain cancer Maternal Grandfather 46  ? Rheum arthritis Paternal Grandmother   ? Esophageal cancer Neg Hx   ? Stomach cancer Neg Hx   ? Liver disease Neg Hx   ? Crohn's disease Neg Hx   ? Rectal cancer Neg Hx   ? ? ?Social History  ? ?Socioeconomic History  ? Marital status: Married  ?  Spouse name: Not on file  ? Number of children: 0  ? Years of education: Not on file  ? Highest education level: Not on file  ?Occupational History  ? Occupation: [employer]  ?  Comment: [employer]  ?Tobacco Use  ? Smoking status: Never  ?  Passive exposure: Past (25 YEARS AGO,WITH IN-LAWS)  ? Smokeless tobacco: Never  ?Vaping Use  ? Vaping Use: Never used  ?Substance and Sexual Activity  ? Alcohol use: Not Currently  ? Drug use: No  ? Sexual activity: Not Currently  ?  Birth control/protection: Post-menopausal  ?Other Topics Concern  ? Not on file  ?Social History Narrative  ? Not on file  ? ?Social Determinants of Health  ? ?Financial Resource Strain: Not on file  ?Food Insecurity: Not on file  ?Transportation Needs: Not on file  ?Physical Activity: Not on file  ?Stress: Not on file  ?Social Connections: Not on file  ?Intimate Partner Violence: Not on file  ? ? ?Physical Exam: ?Vital signs in last 24 hours: ?'@BP'$  112/70   Pulse 61   Temp (!) 97.5 ?F (36.4 ?C)   Ht '5\' 3"'$  (1.6 m)   Wt 97 lb (44 kg)   LMP 11/05/2015 (Approximate)   SpO2 100%   BMI 17.18 kg/m?  ?GEN: NAD ?EYE: Sclerae anicteric ?ENT: MMM ?CV: Non-tachycardic ?Pulm: CTA b/l ?GI: Soft, NT/ND ?NEURO:  Alert & Oriented x 3 ? ? ?Zenovia Jarred, MD ?[facility] ? ?07/28/2021 8:11 AM ? ?

## 2021-07-28 NOTE — Progress Notes (Signed)
Called to room to assist during endoscopic procedure.  Patient ID and intended procedure confirmed with present staff. Received instructions for my participation in the procedure from the performing physician.  

## 2021-08-01 ENCOUNTER — Telehealth: Payer: Self-pay | Admitting: *Deleted

## 2021-08-01 NOTE — Telephone Encounter (Signed)
?  Follow up Call- ? ? ?  07/28/2021  ?  7:11 AM  ?Call back number  ?Post procedure Call Back phone  # [phone]  ?Permission to leave phone message Yes  ?  ?No answer at 2nd attempt follow up phone call.  Left message on voicemail.   ?

## 2021-08-01 NOTE — Telephone Encounter (Signed)
Attempted f/u phone call. No answer. Left message. °

## 2021-08-02 ENCOUNTER — Encounter: Payer: Self-pay | Admitting: Internal Medicine

## 2021-08-17 DIAGNOSIS — H18452 Nodular corneal degeneration, left eye: Secondary | ICD-10-CM | POA: Diagnosis not present

## 2021-08-17 DIAGNOSIS — H2513 Age-related nuclear cataract, bilateral: Secondary | ICD-10-CM | POA: Diagnosis not present

## 2021-08-22 DIAGNOSIS — F411 Generalized anxiety disorder: Secondary | ICD-10-CM | POA: Diagnosis not present

## 2021-09-23 DIAGNOSIS — U071 COVID-19: Secondary | ICD-10-CM | POA: Diagnosis not present

## 2021-09-23 DIAGNOSIS — R5383 Other fatigue: Secondary | ICD-10-CM | POA: Diagnosis not present

## 2021-09-28 DIAGNOSIS — R7989 Other specified abnormal findings of blood chemistry: Secondary | ICD-10-CM | POA: Diagnosis not present

## 2021-10-04 DIAGNOSIS — Z0000 Encounter for general adult medical examination without abnormal findings: Secondary | ICD-10-CM | POA: Diagnosis not present

## 2021-10-04 DIAGNOSIS — Z1339 Encounter for screening examination for other mental health and behavioral disorders: Secondary | ICD-10-CM | POA: Diagnosis not present

## 2021-10-04 DIAGNOSIS — Z1331 Encounter for screening for depression: Secondary | ICD-10-CM | POA: Diagnosis not present

## 2021-10-04 DIAGNOSIS — Z23 Encounter for immunization: Secondary | ICD-10-CM | POA: Diagnosis not present

## 2021-11-08 DIAGNOSIS — Z780 Asymptomatic menopausal state: Secondary | ICD-10-CM | POA: Diagnosis not present

## 2021-11-08 DIAGNOSIS — M8588 Other specified disorders of bone density and structure, other site: Secondary | ICD-10-CM | POA: Diagnosis not present

## 2021-11-08 DIAGNOSIS — Z1231 Encounter for screening mammogram for malignant neoplasm of breast: Secondary | ICD-10-CM | POA: Diagnosis not present

## 2021-11-08 DIAGNOSIS — M810 Age-related osteoporosis without current pathological fracture: Secondary | ICD-10-CM | POA: Diagnosis not present

## 2021-11-13 DIAGNOSIS — R229 Localized swelling, mass and lump, unspecified: Secondary | ICD-10-CM | POA: Diagnosis not present

## 2021-11-13 DIAGNOSIS — W57XXXA Bitten or stung by nonvenomous insect and other nonvenomous arthropods, initial encounter: Secondary | ICD-10-CM | POA: Diagnosis not present

## 2021-11-17 ENCOUNTER — Encounter (HOSPITAL_BASED_OUTPATIENT_CLINIC_OR_DEPARTMENT_OTHER): Payer: Self-pay | Admitting: *Deleted

## 2021-11-17 DIAGNOSIS — E2839 Other primary ovarian failure: Secondary | ICD-10-CM

## 2021-11-17 DIAGNOSIS — Z780 Asymptomatic menopausal state: Secondary | ICD-10-CM

## 2022-01-30 ENCOUNTER — Other Ambulatory Visit (HOSPITAL_COMMUNITY)
Admission: RE | Admit: 2022-01-30 | Discharge: 2022-01-30 | Disposition: A | Discharge status: Home / Self Care | Payer: BC Managed Care – PPO | Source: Ambulatory Visit | Attending: Obstetrics & Gynecology | Admitting: Obstetrics & Gynecology

## 2022-01-30 ENCOUNTER — Ambulatory Visit (INDEPENDENT_AMBULATORY_CARE_PROVIDER_SITE_OTHER): Payer: BC Managed Care – PPO | Admitting: Obstetrics & Gynecology

## 2022-01-30 ENCOUNTER — Encounter (HOSPITAL_BASED_OUTPATIENT_CLINIC_OR_DEPARTMENT_OTHER): Payer: Self-pay | Admitting: Obstetrics & Gynecology

## 2022-01-30 VITALS — BP 131/80 | HR 63 | Ht 63.5 in | Wt 98.4 lb

## 2022-01-30 DIAGNOSIS — Z124 Encounter for screening for malignant neoplasm of cervix: Secondary | ICD-10-CM | POA: Insufficient documentation

## 2022-01-30 DIAGNOSIS — R5382 Chronic fatigue, unspecified: Secondary | ICD-10-CM

## 2022-01-30 DIAGNOSIS — M62838 Other muscle spasm: Secondary | ICD-10-CM

## 2022-01-30 DIAGNOSIS — Z01419 Encounter for gynecological examination (general) (routine) without abnormal findings: Secondary | ICD-10-CM | POA: Diagnosis not present

## 2022-01-30 DIAGNOSIS — N942 Vaginismus: Secondary | ICD-10-CM

## 2022-01-30 DIAGNOSIS — M810 Age-related osteoporosis without current pathological fracture: Secondary | ICD-10-CM | POA: Diagnosis not present

## 2022-01-30 MED ORDER — ESTRADIOL 0.1 MG/GM VA CREA: 0.5000 g | TOPICAL_CREAM | VAGINAL | 3 refills | Status: DC

## 2022-01-30 NOTE — Progress Notes (Signed)
61 y.o. G0P0000 Married White or Caucasian female here for annual exam.  Doing well.  Had colonoscopy this year.  No vaginal bleeding.  BMD discussed from August.  Early osteoporosis noted.  She is exercising she is not doing much weight bearing.  Does get calcium.  Discussed today.  Having morning grogginess that has been going on the past three years.  Used to be a morning person but feels harder to get moving in the morning.    Patient's last menstrual period was 11/04/2015.          Sexually active: No.  The current method of family planning is post menopausal status.    Exercising: Yes.     cardio Smoker:  yes  Health Maintenance: Pap:  11/07/2018  Negative History of abnormal Pap:  no MMG:  11/08/2021 Negative Colonoscopy:  07/28/2021, Dr. Hilarie Fredrickson, follow up 10 years BMD:   11/08/2021 Early Osteoporosis Screening Labs: ordered today   reports that she has never smoked. She has been exposed to tobacco smoke. She has never used smokeless tobacco. She reports that she does not currently use alcohol. She reports that she does not use drugs.  Past Medical History:  Diagnosis Date   Arthritis    EARLY ARTHRITIS IN NECK   Cyst of left kidney 07/24/2014   simple appearing left mid kidney cyst   GERD (gastroesophageal reflux disease) 09/2011   Hiatal hernia    Hyperlipidemia    Internal hemorrhoids    Overactive bladder    Pericardial effusion 07/24/2014   trace anterior   Uterine fibroid 07/24/2014   2.4 cm subserosal left fundal    Past Surgical History:  Procedure Laterality Date   COLONOSCOPY     HEMORRHOID BANDING     HYSTEROSCOPY  03/30/2008   resction of polyps and D&C   TONSILLECTOMY AND ADENOIDECTOMY  age 89   WISDOM TOOTH EXTRACTION  age 64    Current Outpatient Medications  Medication Sig Dispense Refill   carboxymethylcellulose (REFRESH PLUS) 0.5 % SOLN 1 drop 2 (two) times daily as needed.      Eyelid Cleansers (AVENOVA) 0.01 % SOLN Apply topically 2 (two)  times daily.     estradiol (ESTRACE) 0.1 MG/GM vaginal cream Place 0.5 g vaginally 2 (two) times a week. Place 0.5g nightly for two weeks then twice a week after 30 g 3   phenylephrine-shark liver oil-mineral oil-petrolatum (PREPARATION H) 0.25-14-74.9 % rectal ointment Place 1 application rectally 2 (two) times daily as needed for hemorrhoids. (Patient not taking: Reported on 01/28/2021)     Wheat Dextrin (BENEFIBER PO) Take by mouth. 2 Tablespoons daily     Witch Hazel (PREPARATION H TOTABLES WIPES EX) Apply topically. (Patient not taking: Reported on 01/28/2021)     No current facility-administered medications for this visit.    Family History  Problem Relation Age of Onset   Atrial fibrillation Father    Diabetes Father    Heart disease Brother    Breast cancer Paternal Aunt    Colon cancer Maternal Grandmother 23   Lung cancer Maternal Grandmother 97   Brain cancer Maternal Grandfather 85   Rheum arthritis Paternal Grandmother    Esophageal cancer Neg Hx    Stomach cancer Neg Hx    Liver disease Neg Hx    Crohn's disease Neg Hx    Rectal cancer Neg Hx     ROS: Constitutional: negative Genitourinary:negative  Exam:   BP 131/80 (BP Location: Right Arm, Patient Position: Sitting, Cuff Size:  Normal)   Pulse 63   Ht 5' 3.5" (1.613 m) Comment: Reported  Wt 98 lb 6.4 oz (44.6 kg)   LMP 11/04/2015   BMI 17.16 kg/m   Height: 5' 3.5" (161.3 cm) (Reported)  General appearance: alert, cooperative and appears stated age Head: Normocephalic, without obvious abnormality, atraumatic Neck: no adenopathy, supple, symmetrical, trachea midline and thyroid normal to inspection and palpation Lungs: clear to auscultation bilaterally Breasts: normal appearance, no masses or tenderness Heart: regular rate and rhythm Abdomen: soft, non-tender; bowel sounds normal; no masses,  no organomegaly Extremities: extremities normal, atraumatic, no cyanosis or edema Skin: Skin color, texture, turgor  normal. No rashes or lesions Lymph nodes: Cervical, supraclavicular, and axillary nodes normal. No abnormal inguinal nodes palpated Neurologic: Grossly normal   Pelvic: External genitalia:  no lesions              Urethra:  normal appearing urethra with no masses, tenderness or lesions              Bartholins and Skenes: normal                 Vagina: normal appearing vagina with normal color and no discharge, no lesions              Cervix: no lesions              Pap taken: Yes.   Bimanual Exam:  Uterus:  normal size, contour, position, consistency, mobility, non-tender              Adnexa: normal adnexa and no mass, fullness, tenderness               Rectovaginal: Confirms               Anus:  normal sphincter tone, no lesions  Chaperone, Octaviano Batty, CMA, was present for exam.  Assessment/Plan: 1. Well woman exam with routine gynecological exam - Pap smear obtained today with HR HPV - Mammogram 11/2021 - Colonoscopy 2023.  Follow up 10 years. - Bone mineral density done 11/2021 - lab work ordered today - vaccines reviewed/updated   2. Age-related osteoporosis without current pathological fracture - VITAMIN D 25 Hydroxy (Vit-D Deficiency, Fractures) - PTH, intact (no Ca) - TSH  3. Chronic fatigue - B12  4. Levator spasm - estradiol (ESTRACE) 0.1 MG/GM vaginal cream; Place 0.5 g vaginally 2 (two) times a week. Place 0.5g nightly for two weeks then twice a week after  Dispense: 30 g; Refill: 3  5.  Osteoporosis  - labs ordered.  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  Getting adequate calcium.  Repeat BMD in 2025.

## 2022-01-31 LAB — TSH: TSH: 1.4 u[IU]/mL (ref 0.450–4.500)

## 2022-01-31 LAB — PARATHYROID HORMONE, INTACT (NO CA): PTH: 27 pg/mL (ref 15–65)

## 2022-01-31 LAB — VITAMIN D 25 HYDROXY (VIT D DEFICIENCY, FRACTURES): Vit D, 25-Hydroxy: 47.1 ng/mL (ref 30.0–100.0)

## 2022-01-31 LAB — VITAMIN B12: Vitamin B-12: >2000 pg/mL — ABNORMAL HIGH (ref 232–1245)

## 2022-02-01 LAB — CYTOLOGY - PAP
Comment: NEGATIVE
Diagnosis: NEGATIVE
High risk HPV: NEGATIVE

## 2022-03-07 ENCOUNTER — Encounter (HOSPITAL_BASED_OUTPATIENT_CLINIC_OR_DEPARTMENT_OTHER): Payer: Self-pay | Admitting: Obstetrics & Gynecology

## 2022-03-08 ENCOUNTER — Telehealth (HOSPITAL_BASED_OUTPATIENT_CLINIC_OR_DEPARTMENT_OTHER): Payer: Self-pay | Admitting: Obstetrics & Gynecology

## 2022-05-03 NOTE — Telephone Encounter (Signed)
No Encounter needed

## 2022-06-08 ENCOUNTER — Ambulatory Visit (INDEPENDENT_AMBULATORY_CARE_PROVIDER_SITE_OTHER): Payer: BC Managed Care – PPO | Admitting: Neurology

## 2022-06-08 ENCOUNTER — Encounter: Payer: Self-pay | Admitting: Neurology

## 2022-06-08 VITALS — BP 114/77 | HR 61 | Ht 63.0 in | Wt 98.0 lb

## 2022-06-08 DIAGNOSIS — R4189 Other symptoms and signs involving cognitive functions and awareness: Secondary | ICD-10-CM | POA: Insufficient documentation

## 2022-06-08 DIAGNOSIS — F519 Sleep disorder not due to a substance or known physiological condition, unspecified: Secondary | ICD-10-CM | POA: Diagnosis not present

## 2022-06-08 NOTE — Progress Notes (Signed)
SLEEP MEDICINE CLINIC    Provider:  Larey Seat, MD  Primary Care Physician:  Michael Boston, [place] [address]     Referring Provider: Michael Boston, [place],  [place] [address]          Chief Complaint according to patient   Patient presents with:     New Patient (Initial Visit)     RM 1 alone Pt is well, reports she sleeps relatively well. She is more concerned with waking up feeling foggy. She feels like she can't think like her cognition is off and she isn't alert. She has been having these symptoms since 2020, 5-7 days a week.       HISTORY OF PRESENT ILLNESS:  Laurie Griffith is a 62 y.o. female patient of Ashkenazi descent who is seen upon referral on 06/08/2022 from Dr Jacalyn Lefevre, MD  for a Sleep Medicine Consultation .  Chief concern according to patient :  This 62 year old  female patient reports she sleeps relatively well. She is more concerned with waking up feeling foggy, unable to put her thoughts through, unable to focus. She feels like she can't think like her cognition is off and she isn't alert.  Right ear popping in AM, She has been having these symptoms since 2020, some times 5-7 days a week. She has tried to correlate this to dietary intake ,to sleeping on her side, to low BP in am. " I feel so dry in AM " and :" I sometimes forget to breathe or can't breath deeply. This may not be a sleep disorder but I like  it evaluated"   I have the pleasure of seeing Laurie Griffith 06/08/22 a right-handed female with a possible sleep disorder.    Sleep relevant medical history: Tonsillectomy at age 44,  wisdom teeth extracted, crowded teeth, braces, retainers, cervical spine whiplash 2018, fall from stares 2020. Shoulder and neck tension, posture  addressed with PT.  Tooth surgery in July 2023. Within 2 weeks she developed  right buccal pain, facial numbness, and feeling of weakness. Trigeminal ?   Family medical /sleep history: No other family  member on CPAP with OSA, insomnia in her father, mother was a good sleeper.    Social history: has one sibling , a brother healthy.  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   and lives in a household with spouse,  no children, not pets.  The patient currently works. Tobacco use; none .  ETOH use : seldomly , one a months . Caffeine intake in form of Coffee( //) Soda( //) Tea ( //) or energy drinks Exercise in form of  walking .      Sleep habits are as follows: The patient's dinner time is between  5-7 PM. Ice cream at 7.30-8, nuts for snack.  The patient goes to bed at 10 PM and continues to sleep for 7 hours, wakes for  bathroom breaks,The preferred sleep position is lateral , with the support of 1 pillow.  Dreams are reportedly rare/ infrequent. The patient wakes up spontaneously/ 5.40  AM is the usual rise time.  She reports not feeling refreshed or restored in AM, with symptoms such as dry mouth, Naps are never taken .   Review of Systems: Out of a complete 14 system review, the patient complains of only the following symptoms, and all other reviewed systems are negative.:   Fatigue,  sleepiness , snoring- husband denies.    How likely are you to doze in the following situations: 0 = not likely, 1 = slight chance, 2 = moderate chance, 3 = high chance   Sitting and Reading? Watching Television? Sitting inactive in a public place (theater or meeting)? As a passenger in a car for an hour without a break? Lying down in the afternoon when circumstances permit? Sitting and talking to someone? Sitting quietly after lunch without alcohol? In a car, while stopped for a few minutes in traffic?   Total = 1/ 24 points   FSS endorsed at 19/ 63 points.   GDS 3/ 15   Social History   Socioeconomic History   Marital status: Married    Spouse name: Not on file   Number of children: 0   Years of education: Not on file   Highest education level:  Not on file  Occupational History   Occupation: [employer]    Comment: Nutritional therapist [employer]  Tobacco Use   Smoking status: Never    Passive exposure: Past (50 YEARS AGO,WITH IN-LAWS)   Smokeless tobacco: Never  Vaping Use   Vaping Use: Never used  Substance and Sexual Activity   Alcohol use: Not Currently   Drug use: No   Sexual activity: Not Currently    Birth control/protection: Post-menopausal  Other Topics Concern   Not on file  Social History Narrative   Not on file   Social Determinants of Health   Financial Resource Strain: Not on file  Food Insecurity: Not on file  Transportation Needs: Not on file  Physical Activity: Not on file  Stress: Not on file  Social Connections: Not on file    Family History  Problem Relation Age of Onset   Atrial fibrillation Father    Diabetes Father    Heart disease Brother    Breast cancer Paternal Aunt    Colon cancer Maternal Grandmother 44   Lung cancer Maternal Grandmother 97   Brain cancer Maternal Grandfather 85   Rheum arthritis Paternal Grandmother    Esophageal cancer Neg Hx    Stomach cancer Neg Hx    Liver disease Neg Hx    Crohn's disease Neg Hx    Rectal cancer Neg Hx     Past Medical History:  Diagnosis Date   Arthritis    EARLY ARTHRITIS IN NECK   Cyst of left kidney 07/24/2014   simple appearing left mid kidney cyst   GERD (gastroesophageal reflux disease) 09/2011   Hiatal hernia    Hyperlipidemia    Internal hemorrhoids    Overactive bladder    Pericardial effusion 07/24/2014   trace anterior   Uterine fibroid 07/24/2014   2.4 cm subserosal left fundal    Past Surgical History:  Procedure Laterality Date   COLONOSCOPY     HEMORRHOID BANDING     HYSTEROSCOPY  03/30/2008   resction of polyps and D&C   TONSILLECTOMY AND ADENOIDECTOMY  age 56   WISDOM TOOTH EXTRACTION  age 71     Current Outpatient Medications on File Prior to Visit  Medication Sig Dispense Refill    carboxymethylcellulose (REFRESH PLUS) 0.5 % SOLN 1 drop 2 (two) times daily as needed.      Eyelid Cleansers (AVENOVA) 0.01 % SOLN Apply topically 2 (two) times daily.     Wheat Dextrin (BENEFIBER PO) Take by mouth. 2 Tablespoons daily     No current facility-administered medications on file prior to visit.  Allergies  Allergen Reactions   Doxycycline Nausea Only   Food     wheat   Nickel Other (See Comments)   Penicillins Rash   Sulfa Antibiotics Rash   Sulfasalazine Rash     DIAGNOSTIC DATA (LABS, IMAGING, TESTING) - I reviewed patient records, labs, notes, testing and imaging myself where available.  Lab Results  Component Value Date   WBC 6.2 04/24/2017   HGB 13.0 04/24/2017   HCT 38.9 04/24/2017   MCV 89 04/24/2017   PLT 234 04/24/2017      Component Value Date/Time   NA 142 04/24/2017 1339   K 4.3 04/24/2017 1339   CL 101 04/24/2017 1339   CO2 23 04/24/2017 1339   GLUCOSE 87 04/24/2017 1339   GLUCOSE 97 02/06/2012 1942   BUN 22 04/24/2017 1339   CREATININE 0.64 04/24/2017 1339   CREATININE 0.83 02/06/2012 1942   CALCIUM 9.8 04/24/2017 1339   PROT 6.8 04/24/2017 1339   ALBUMIN 4.6 04/24/2017 1339   AST 27 04/24/2017 1339   ALT 22 04/24/2017 1339   ALKPHOS 74 04/24/2017 1339   BILITOT 0.3 04/24/2017 1339   GFRNONAA 100 04/24/2017 1339   GFRAA 115 04/24/2017 1339   Lab Results  Component Value Date   CHOL 227 (H) 04/24/2017   HDL 92 04/24/2017   LDLCALC 125 (H) 04/24/2017   TRIG 51 04/24/2017   CHOLHDL 2.5 04/24/2017   No results found for: "HGBA1C" Lab Results  Component Value Date   VITAMINB12 >2000 (H) 01/30/2022   Lab Results  Component Value Date   TSH 1.400 01/30/2022    PHYSICAL EXAM:  Today's Vitals   06/08/22 1535  BP: 114/77  Pulse: 61  Weight: 98 lb (44.5 kg)  Height: '5\' 3"'$  (1.6 m)   Body mass index is 17.36 kg/m.   Wt Readings from Last 3 Encounters:  06/08/22 98 lb (44.5 kg)  01/30/22 98 lb 6.4 oz (44.6 kg)  07/28/21  97 lb (44 kg)     Ht Readings from Last 3 Encounters:  06/08/22 '5\' 3"'$  (1.6 m)  01/30/22 5' 3.5" (1.613 m)  07/28/21 '5\' 3"'$  (1.6 m)      General: The patient is awake, alert and appears not in acute distress. The patient is well groomed. Head: Normocephalic, atraumatic. Neck is supple. Mallampati 2,  neck circumference:11 inches. Nasal airflow  patent.   Retrognathia is not seen.  Dental status: crowded, small  Cardiovascular:  Regular rate and cardiac rhythm by pulse,  without distended neck veins. Respiratory: Lungs are clear to auscultation.  Skin:  Without evidence of ankle edema, or rash. Trunk: The patient's posture is erect.   NEUROLOGIC EXAM: The patient is awake and alert, oriented to place and time.   Memory subjective described as intact.  Attention span & concentration ability appears normal.  Speech is fluent,  without  dysarthria, dysphonia or aphasia.  Mood and affect are appropriate.   Cranial nerves: no loss of smell or taste reported  Pupils are equal and briskly reactive to light. Funduscopic exam deferred. .  Extraocular movements in vertical and horizontal planes were intact and without nystagmus. No Diplopia. Visual fields by finger perimetry are intact. Hearing was intact to soft voice and finger rubbing.    Facial sensation intact to fine touch.  Facial motor strength is symmetric and tongue and uvula move midline.  Neck ROM : rotation, tilt and flexion extension were normal for age and shoulder shrug was symmetrical.    Motor exam:  Symmetric bulk, tone and ROM.   Normal tone without cog-wheeling, symmetric grip strength .   Sensory:  Fine touch, pinprick and vibration were tested  and  normal.  Proprioception tested in the upper extremities was normal.   Coordination: Rapid alternating movements in the fingers/hands were of normal speed.  The Finger-to-nose maneuver was intact without evidence of ataxia, dysmetria or tremor.   Gait and station:  Patient could rise unassisted from a seated position,Toe and heel walk were deferred.  Deep tendon reflexes: in the  upper and lower extremities are symmetric and intact.  Babinski response was deferred .    ASSESSMENT AND PLAN 62 y.o. year old female  here with:   1) non focal neurological exam with a concern about morning brain fog.  Unrelated to any infection, unrelated to COVID, symptom onset was 2020.  2) she is neither having insomnia nor hypersomnia. Sleep inertia/ sleep drunkenness.   3) she reports similar foggy symptoms before she introduced herself to gluten free diet. IBS has improved a lot.     I suggested a HST to just screen for organic sleep disorders, but admitted that this may be of low yield. We can follow up with a PSG if there is a concern about limb movements or cardiac arrhyhtmia.  But we go first with aHST   I plan to follow up either personally or through our NP within 4 months.   I would like to thank Jacalyn Lefevre Jesse Sans, MD and Michael Boston, Md [address] [place],  [place] [address] for allowing me to meet with and to take care of this pleasant patient.   CC: I will share my notes with: PCP.  After spending a total time of 45 minutes face to face and additional time for physical and neurologic examination, review of laboratory studies,  personal review of imaging studies, reports and results of other testing and review of referral information / records as far as provided in visit,   Electronically signed by: Larey Seat, MD 06/08/2022 3:59 PM  [facility] Board certified by The [employer] of Sleep Medicine and Diplomate of the [employer] of Sleep Medicine. Board certified In Neurology through the [place], Fellow of the [employer] of Neurology. Medical Director of [employer].

## 2022-06-08 NOTE — Patient Instructions (Signed)
Healthy Living: Sleep In this video, you will learn why sleep is an important part of a healthy lifestyle. To view the content, go to this web address: [email or web address]  This video will expire on: 03/22/2024. If you need access to this video following this date, please reach out to the healthcare provider who assigned it to you. This information is not intended to replace advice given to you by your health care provider. Make sure you discuss any questions you have with your health care provider. Elsevier Patient Education  [place].

## 2022-06-28 ENCOUNTER — Institutional Professional Consult (permissible substitution): Payer: BC Managed Care – PPO | Admitting: Neurology

## 2022-06-29 ENCOUNTER — Telehealth: Payer: Self-pay | Admitting: Neurology

## 2022-06-29 NOTE — Telephone Encounter (Signed)
HST-BCBS Auth: [id number] (exp. 06/29/22 to 08/27/22)   Patient is scheduled at [facility] for 07/18/22 at 8 AM.  Mail packet to the patient.

## 2022-07-18 ENCOUNTER — Ambulatory Visit: Payer: BC Managed Care – PPO | Admitting: Neurology

## 2022-07-18 DIAGNOSIS — G4710 Hypersomnia, unspecified: Secondary | ICD-10-CM

## 2022-07-18 DIAGNOSIS — F519 Sleep disorder not due to a substance or known physiological condition, unspecified: Secondary | ICD-10-CM

## 2022-07-18 DIAGNOSIS — R4189 Other symptoms and signs involving cognitive functions and awareness: Secondary | ICD-10-CM

## 2022-07-19 NOTE — Progress Notes (Signed)
[facility] Laurie Griffith 62 year old female 28-Jul-1960   HOME SLEEP TEST REPORT ( by Watch PAT)   STUDY DATA:  07-19-2022   ORDERING CLINICIAN: Melvyn Novas, MD  REFERRING CLINICIAN: Dr Nadene Rubins, MD   CLINICAL INFORMATION/HISTORY: Pt is well, reports she sleeps relatively well. She is more concerned with waking up feeling foggy. She feels some mornings that she can't think , her cognition is impaired and she isn't feeling as if fully alert.  A groggy sensation. She has been having these symptoms since 2020, 5-7 days a week.  Also : facial numbness, neck tension after whiplash injury, fall from stairs 2020, and she reports limitations to depth of her breathing.      Epworth sleepiness score: 1/24.  FSS at 19/ 63 points   BMI:  17.36 kg/m   Neck Circumference: 11 cm    FINDINGS:   Sleep Summary:   Total Recording Time (hours, min): 6 hours 58 minutes       Total Sleep Time (hours, min): 5 hours 57 minutes               Percent REM (%):    26%, rounded                                    Respiratory Indices:   Calculated pAHI (per hour): 2.7/h      , no central events.                     REM pAHI: 4.6/h                                               NREM pAHI:    2.1/h                          Positional AHI: Supine sleep position was associated with an AHI of 2.9/h and left sided sleep position with an AHI of 3.5/h.   There were also 35 minutes in right lateral sleep position associated with an AHI of 0.0.                                                 Snoring was at threshold for the device registering, mean volume of 40 dB, only present for left then 10% of the recorded sleep time.  Oxygen Saturation Statistics: O2 Saturation Range (%):    Between a nadir at 92 and a maximum saturation at 98% with a mean saturation at 94%                                   O2 Saturation (minutes) <89%: 0 minutes          Pulse Rate Statistics:   Pulse  Mean (bpm):    56 bpm             Pulse Range:   Between 47 and 79 bpm, only the heart rate is reflected here and  no cardiac rhythm data can be extrapolated.              IMPRESSION:  This HST confirms that sleep apnea is not present.  There is no sign of sleep hypoxia, no clinically significant snoring was recorded, and heart rate varied especially during REM sleep, with intermittent brief bradycardia spells. Sleep was recorded between 10:30 PM and 5:10 AM with 47 minutes of wakefulness in between. This was not highly fragmented sleep, but a fairly normal sleep architecture.    RECOMMENDATION:  Sleep disordered breathing could not be verified.  Per report would be more of sleep inertia and insomnia, hypersomnia or apnea. The intermittent bradycardia is not at a critical level.  the patient sent a note stating that she slept poorly that night but she felt great when she woke up in the morning clearheaded.  No sleep disordered breathing, no  explanation for this patient's symptoms of grogginess and sleep inertia.      INTERPRETING PHYSICIAN:   Melvyn Novas, MD   Medical Director of [facility] at [facility].

## 2022-07-25 NOTE — Procedures (Signed)
          [facility] Laurie Griffith 61 year old female 01/25/1961   HOME SLEEP TEST REPORT ( by Watch PAT)   STUDY DATA:  07-19-2022   ORDERING CLINICIAN: Jannice Beitzel, MD  REFERRING CLINICIAN: Dr Wile, MD   CLINICAL INFORMATION/HISTORY: Pt is well, reports she sleeps relatively well. She is more concerned with waking up feeling foggy. She feels some mornings that she can't think , her cognition is impaired and she isn't feeling as if fully alert.  A groggy sensation. She has been having these symptoms since 2020, 5-7 days a week.  Also : facial numbness, neck tension after whiplash injury, fall from stairs 2020, and she reports limitations to depth of her breathing.      Epworth sleepiness score: 1/24.  FSS at 19/ 63 points   BMI:  17.36 kg/m   Neck Circumference: 11 cm    FINDINGS:   Sleep Summary:   Total Recording Time (hours, min): 6 hours 58 minutes       Total Sleep Time (hours, min): 5 hours 57 minutes               Percent REM (%):    26%, rounded                                    Respiratory Indices:   Calculated pAHI (per hour): 2.7/h      , no central events.                     REM pAHI: 4.6/h                                               NREM pAHI:    2.1/h                          Positional AHI: Supine sleep position was associated with an AHI of 2.9/h and left sided sleep position with an AHI of 3.5/h.   There were also 35 minutes in right lateral sleep position associated with an AHI of 0.0.                                                 Snoring was at threshold for the device registering, mean volume of 40 dB, only present for left then 10% of the recorded sleep time.  Oxygen Saturation Statistics: O2 Saturation Range (%):    Between a nadir at 92 and a maximum saturation at 98% with a mean saturation at 94%                                   O2 Saturation (minutes) <89%: 0 minutes          Pulse Rate Statistics:   Pulse  Mean (bpm):    56 bpm             Pulse Range:   Between 47 and 79 bpm, only the heart rate is reflected here and   IMPRESSION:  This HST confirms that sleep apnea is not present.  There is no sign of sleep hypoxia, no clinically significant snoring was recorded, and heart rate varied especially during REM sleep, with intermittent brief bradycardia spells. Sleep was recorded between 10:30 PM and 5:10 AM with 47 minutes of wakefulness in between. This was not highly fragmented sleep, but a fairly normal sleep architecture.    RECOMMENDATION:  Sleep disordered breathing could not be verified.  Per report would be more of sleep inertia and insomnia, hypersomnia or apnea. The intermittent bradycardia is not at a critical level.  the patient sent a note stating that she slept poorly that night but she felt great when she woke up in the morning clearheaded.  No sleep disordered breathing, no  explanation for this patient's symptoms of grogginess and sleep inertia.      INTERPRETING PHYSICIAN:   Melvyn Novas, MD   Medical Director of [facility].

## 2022-08-02 DIAGNOSIS — M9902 Segmental and somatic dysfunction of thoracic region: Secondary | ICD-10-CM | POA: Diagnosis not present

## 2022-08-02 DIAGNOSIS — M542 Cervicalgia: Secondary | ICD-10-CM | POA: Diagnosis not present

## 2022-08-02 DIAGNOSIS — M546 Pain in thoracic spine: Secondary | ICD-10-CM | POA: Diagnosis not present

## 2022-08-02 DIAGNOSIS — M9901 Segmental and somatic dysfunction of cervical region: Secondary | ICD-10-CM | POA: Diagnosis not present

## 2022-08-08 DIAGNOSIS — M9901 Segmental and somatic dysfunction of cervical region: Secondary | ICD-10-CM | POA: Diagnosis not present

## 2022-08-08 DIAGNOSIS — M546 Pain in thoracic spine: Secondary | ICD-10-CM | POA: Diagnosis not present

## 2022-08-08 DIAGNOSIS — M9902 Segmental and somatic dysfunction of thoracic region: Secondary | ICD-10-CM | POA: Diagnosis not present

## 2022-08-08 DIAGNOSIS — M542 Cervicalgia: Secondary | ICD-10-CM | POA: Diagnosis not present

## 2022-08-14 ENCOUNTER — Encounter: Payer: Self-pay | Admitting: Neurology

## 2022-08-14 DIAGNOSIS — M546 Pain in thoracic spine: Secondary | ICD-10-CM | POA: Diagnosis not present

## 2022-08-14 DIAGNOSIS — M9901 Segmental and somatic dysfunction of cervical region: Secondary | ICD-10-CM | POA: Diagnosis not present

## 2022-08-14 DIAGNOSIS — F519 Sleep disorder not due to a substance or known physiological condition, unspecified: Secondary | ICD-10-CM

## 2022-08-14 DIAGNOSIS — M9902 Segmental and somatic dysfunction of thoracic region: Secondary | ICD-10-CM | POA: Diagnosis not present

## 2022-08-14 DIAGNOSIS — R4189 Other symptoms and signs involving cognitive functions and awareness: Secondary | ICD-10-CM

## 2022-08-14 DIAGNOSIS — M542 Cervicalgia: Secondary | ICD-10-CM | POA: Diagnosis not present

## 2022-08-16 DIAGNOSIS — M546 Pain in thoracic spine: Secondary | ICD-10-CM | POA: Diagnosis not present

## 2022-08-16 DIAGNOSIS — M9901 Segmental and somatic dysfunction of cervical region: Secondary | ICD-10-CM | POA: Diagnosis not present

## 2022-08-16 DIAGNOSIS — M9902 Segmental and somatic dysfunction of thoracic region: Secondary | ICD-10-CM | POA: Diagnosis not present

## 2022-08-16 DIAGNOSIS — M542 Cervicalgia: Secondary | ICD-10-CM | POA: Diagnosis not present

## 2022-08-17 NOTE — Addendum Note (Signed)
Addended by: Judi Cong on: 08/17/2022 02:08 PM   Modules accepted: Orders

## 2022-08-21 DIAGNOSIS — M9902 Segmental and somatic dysfunction of thoracic region: Secondary | ICD-10-CM | POA: Diagnosis not present

## 2022-08-21 DIAGNOSIS — M546 Pain in thoracic spine: Secondary | ICD-10-CM | POA: Diagnosis not present

## 2022-08-21 DIAGNOSIS — M542 Cervicalgia: Secondary | ICD-10-CM | POA: Diagnosis not present

## 2022-08-21 DIAGNOSIS — M9901 Segmental and somatic dysfunction of cervical region: Secondary | ICD-10-CM | POA: Diagnosis not present

## 2022-08-22 DIAGNOSIS — M9901 Segmental and somatic dysfunction of cervical region: Secondary | ICD-10-CM | POA: Diagnosis not present

## 2022-08-22 DIAGNOSIS — M546 Pain in thoracic spine: Secondary | ICD-10-CM | POA: Diagnosis not present

## 2022-08-22 DIAGNOSIS — M542 Cervicalgia: Secondary | ICD-10-CM | POA: Diagnosis not present

## 2022-08-22 DIAGNOSIS — M9902 Segmental and somatic dysfunction of thoracic region: Secondary | ICD-10-CM | POA: Diagnosis not present

## 2022-09-05 DIAGNOSIS — M546 Pain in thoracic spine: Secondary | ICD-10-CM | POA: Diagnosis not present

## 2022-09-05 DIAGNOSIS — M9902 Segmental and somatic dysfunction of thoracic region: Secondary | ICD-10-CM | POA: Diagnosis not present

## 2022-09-05 DIAGNOSIS — M542 Cervicalgia: Secondary | ICD-10-CM | POA: Diagnosis not present

## 2022-09-05 DIAGNOSIS — M9901 Segmental and somatic dysfunction of cervical region: Secondary | ICD-10-CM | POA: Diagnosis not present

## 2022-09-07 ENCOUNTER — Telehealth: Payer: Self-pay | Admitting: Neurology

## 2022-09-07 NOTE — Telephone Encounter (Signed)
I reviewed patient's pulse oxymetry results on behalf of Dr. Vickey Huger. Pt had a 3 day pulse ox test on RA, 09/01/2022 through 09/04/2022 for a total recording time of 56 hours, valid sampling hours of 23 hours and 14 minutes.  Starting time was 9:55 PM and ending time was 6:13 AM.  Average oxygen saturation was 95.4%, nadir was 91%, highest oxygen saturation was 99%.  Average pulse rate was 55 bpm, nadir was 46, highest pulse rate was 75 bpm.  Please advise patient that her pulse oximetry test was unremarkable.

## 2022-09-07 NOTE — Telephone Encounter (Signed)
Pt completed ONO on room air for 3 nights in a row through [facility]. Report below. Appears pt had no concerns of low oxygen. Will have Dr Frances Furbish review in Dr Dohmeier's absence and advise pt of result once reviewed.

## 2022-09-19 DIAGNOSIS — M542 Cervicalgia: Secondary | ICD-10-CM | POA: Diagnosis not present

## 2022-09-19 DIAGNOSIS — M9902 Segmental and somatic dysfunction of thoracic region: Secondary | ICD-10-CM | POA: Diagnosis not present

## 2022-09-19 DIAGNOSIS — M9901 Segmental and somatic dysfunction of cervical region: Secondary | ICD-10-CM | POA: Diagnosis not present

## 2022-09-19 DIAGNOSIS — M546 Pain in thoracic spine: Secondary | ICD-10-CM | POA: Diagnosis not present

## 2022-09-25 DIAGNOSIS — J069 Acute upper respiratory infection, unspecified: Secondary | ICD-10-CM | POA: Diagnosis not present

## 2022-09-25 DIAGNOSIS — R058 Other specified cough: Secondary | ICD-10-CM | POA: Diagnosis not present

## 2022-09-26 DIAGNOSIS — M9902 Segmental and somatic dysfunction of thoracic region: Secondary | ICD-10-CM | POA: Diagnosis not present

## 2022-09-26 DIAGNOSIS — M546 Pain in thoracic spine: Secondary | ICD-10-CM | POA: Diagnosis not present

## 2022-09-26 DIAGNOSIS — M9901 Segmental and somatic dysfunction of cervical region: Secondary | ICD-10-CM | POA: Diagnosis not present

## 2022-09-26 DIAGNOSIS — M542 Cervicalgia: Secondary | ICD-10-CM | POA: Diagnosis not present

## 2022-10-02 DIAGNOSIS — M9902 Segmental and somatic dysfunction of thoracic region: Secondary | ICD-10-CM | POA: Diagnosis not present

## 2022-10-02 DIAGNOSIS — M546 Pain in thoracic spine: Secondary | ICD-10-CM | POA: Diagnosis not present

## 2022-10-02 DIAGNOSIS — M542 Cervicalgia: Secondary | ICD-10-CM | POA: Diagnosis not present

## 2022-10-02 DIAGNOSIS — M9901 Segmental and somatic dysfunction of cervical region: Secondary | ICD-10-CM | POA: Diagnosis not present

## 2022-10-04 DIAGNOSIS — M9902 Segmental and somatic dysfunction of thoracic region: Secondary | ICD-10-CM | POA: Diagnosis not present

## 2022-10-04 DIAGNOSIS — M542 Cervicalgia: Secondary | ICD-10-CM | POA: Diagnosis not present

## 2022-10-04 DIAGNOSIS — M546 Pain in thoracic spine: Secondary | ICD-10-CM | POA: Diagnosis not present

## 2022-10-04 DIAGNOSIS — M9901 Segmental and somatic dysfunction of cervical region: Secondary | ICD-10-CM | POA: Diagnosis not present

## 2022-10-17 DIAGNOSIS — M9902 Segmental and somatic dysfunction of thoracic region: Secondary | ICD-10-CM | POA: Diagnosis not present

## 2022-10-17 DIAGNOSIS — M542 Cervicalgia: Secondary | ICD-10-CM | POA: Diagnosis not present

## 2022-10-17 DIAGNOSIS — M546 Pain in thoracic spine: Secondary | ICD-10-CM | POA: Diagnosis not present

## 2022-10-17 DIAGNOSIS — M9901 Segmental and somatic dysfunction of cervical region: Secondary | ICD-10-CM | POA: Diagnosis not present

## 2022-10-27 IMAGING — CT CT CARDIAC CORONARY ARTERY CALCIUM SCORE
3 series · 12 of 20 positions shown, 14 images · non-contrast
Comparison: None.

CLINICAL DATA: Hyperlipidemia

EXAM:
CT CARDIAC CORONARY ARTERY CALCIUM SCORE
TECHNIQUE: Non-contrast imaging through the heart was performed using
prospective ECG gating. Image post processing was performed on an
independent workstation, allowing for quantitative analysis of the
heart and coronary arteries. Note that this exam targets the heart
and the chest was not imaged in its entirety.

[Series 2: calcium scoring 2.00 qr36 bestdiast 69% hrt calciu · axial · 0.29mm/px · z∈[+1633,+1665]mm · 2 of 80 slices shown]
[im 16/80  vessel]
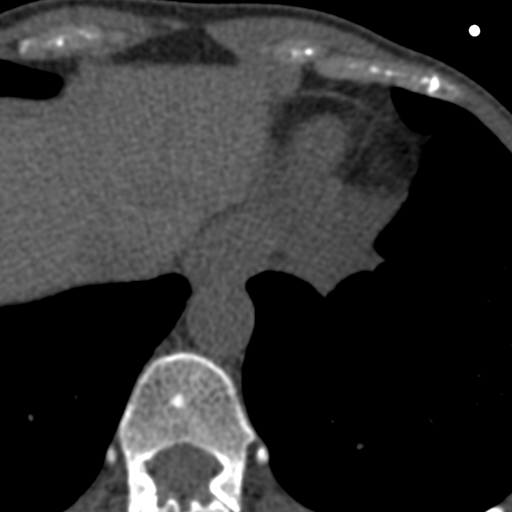
[im 32/80  vessel]
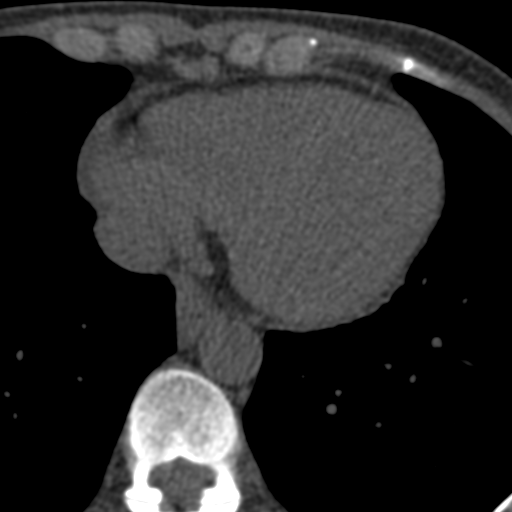

[Series 3: calcium scoring 2.00 br40 bestdiast 69% axial · axial · 0.39mm/px · z∈[+1629,+1733]mm · 5 of 80 slices shown, 7 images]
[im 14/80  vessel]
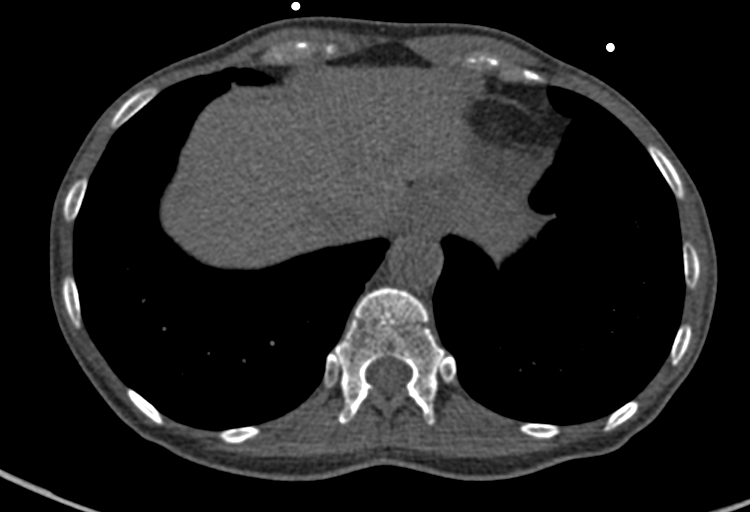
[im 14/80  lung]
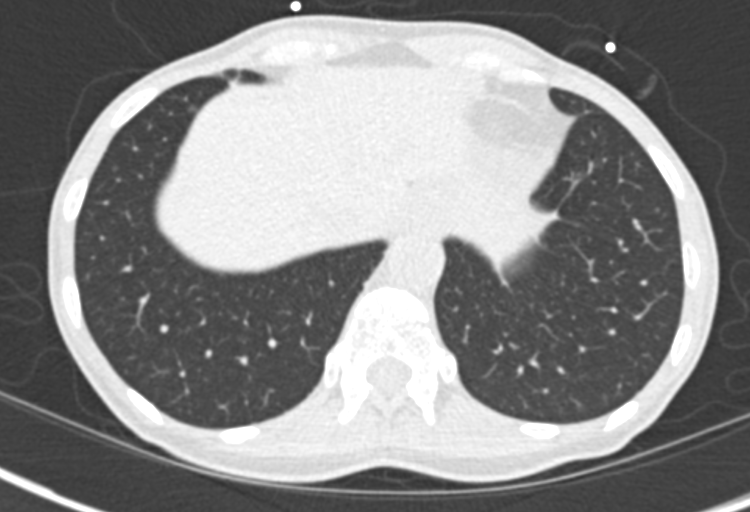
[im 27/80  vessel]
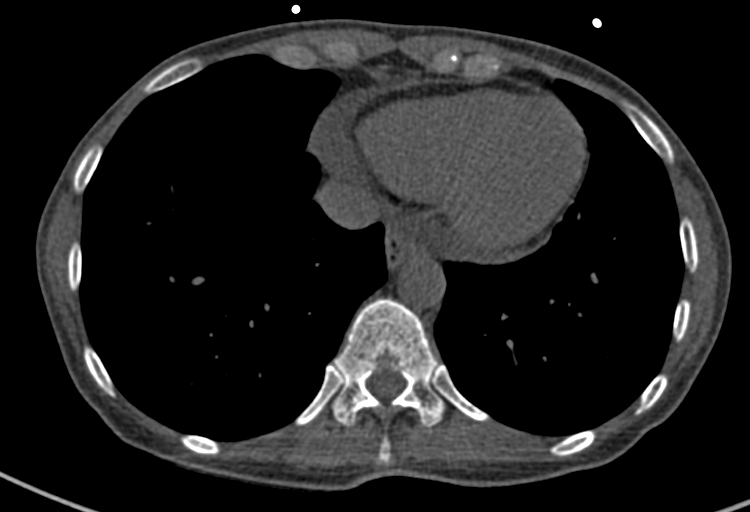
[im 40/80  vessel]
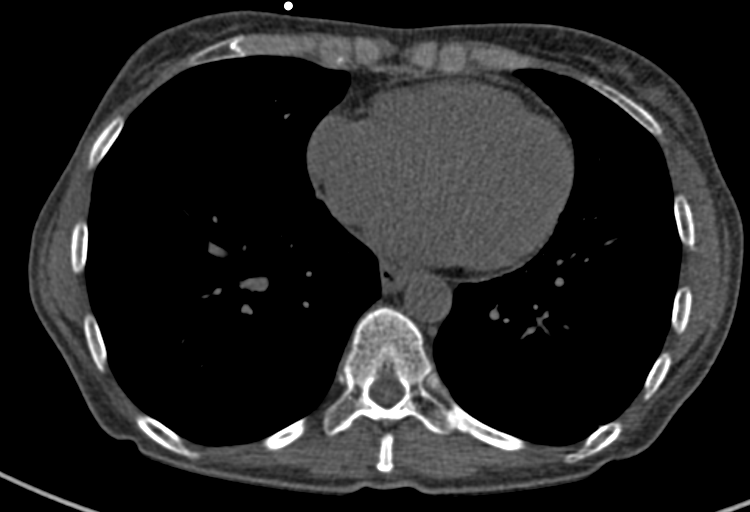
[im 53/80  vessel]
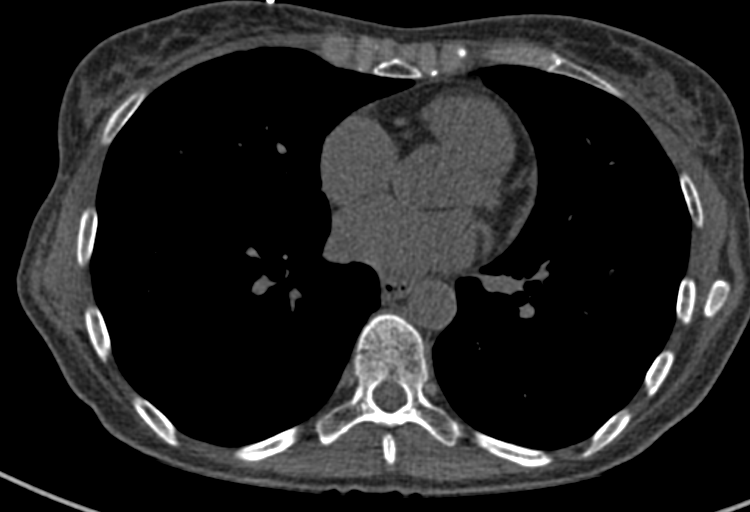
[im 66/80  vessel]
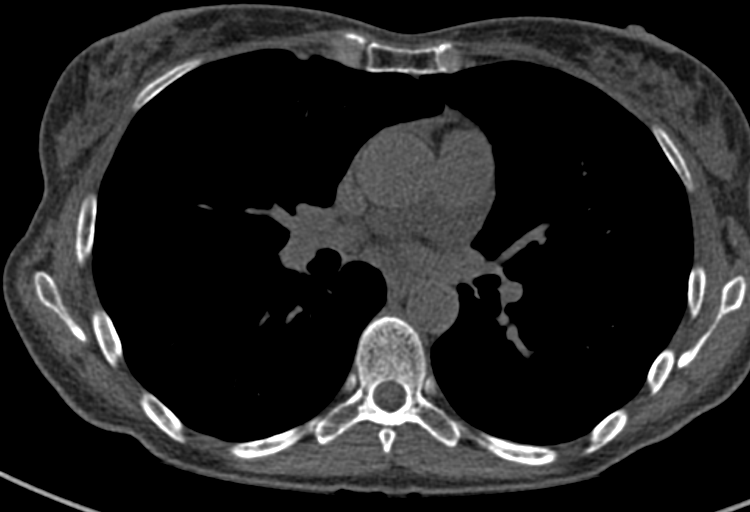
[im 66/80  lung]
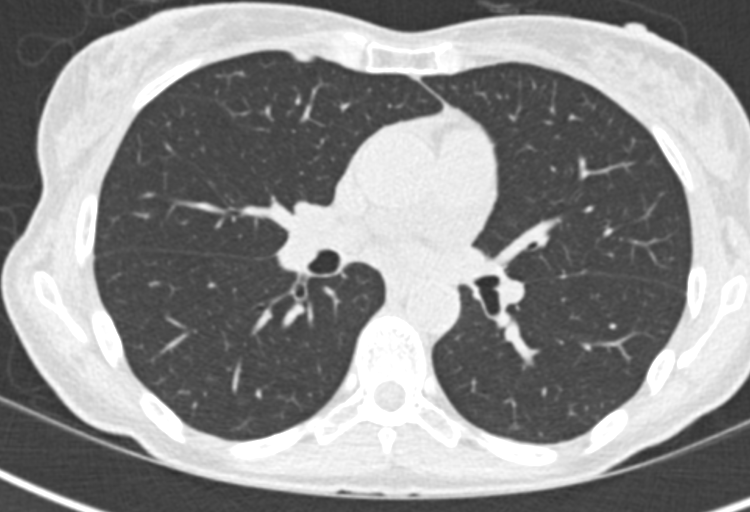

[Series 9: calcium scoring 2.00 br60 bestdiast 69% lungs · axial · 0.39mm/px · z∈[+1631,+1735]mm · 5 of 79 slices shown]
[im 14/79  vessel]
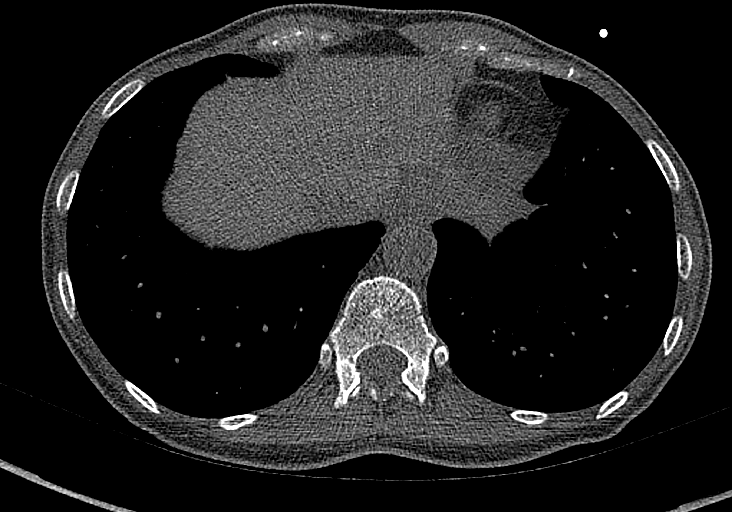
[im 27/79  vessel]
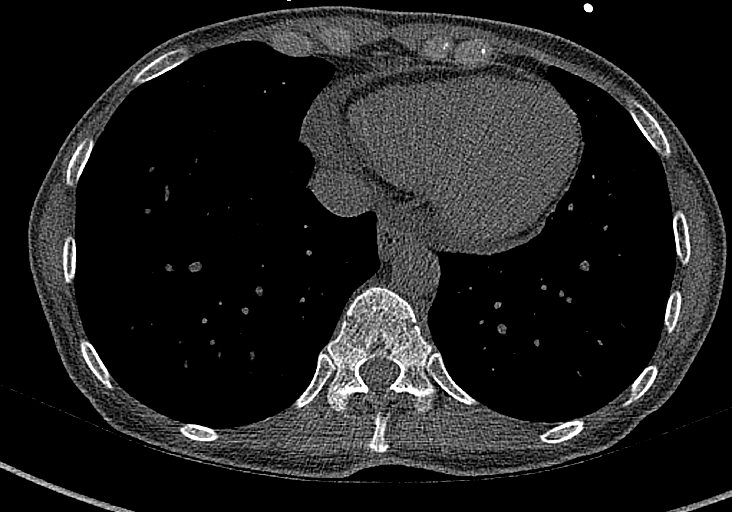
[im 40/79  vessel]
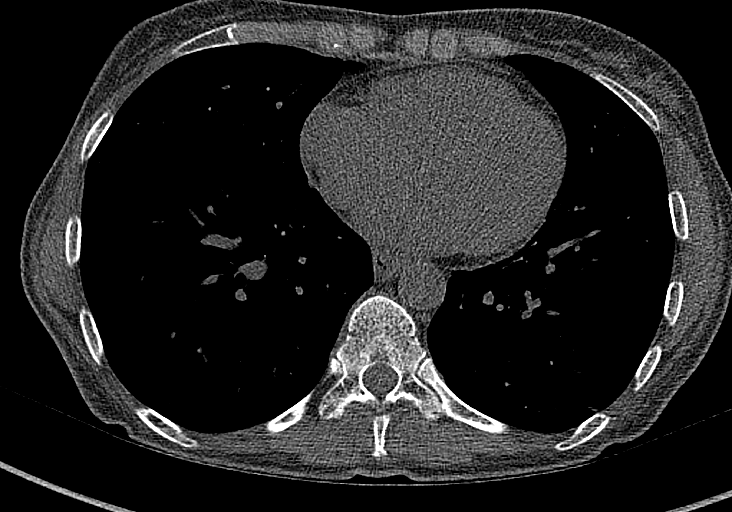
[im 53/79  vessel]
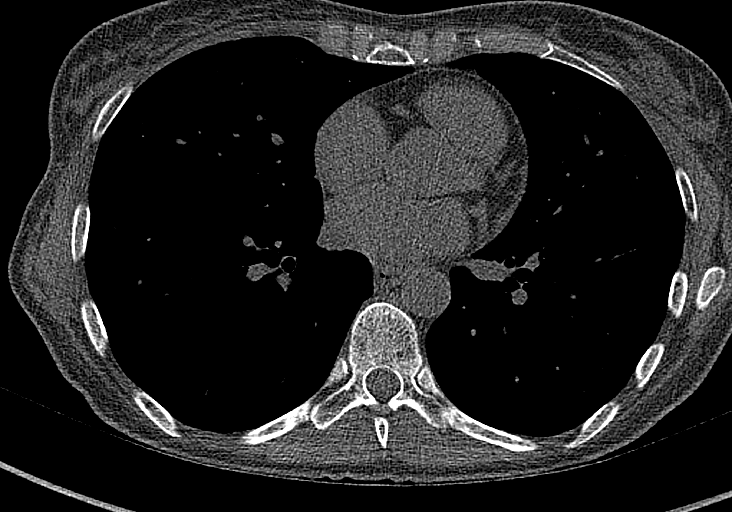
[im 66/79  vessel]
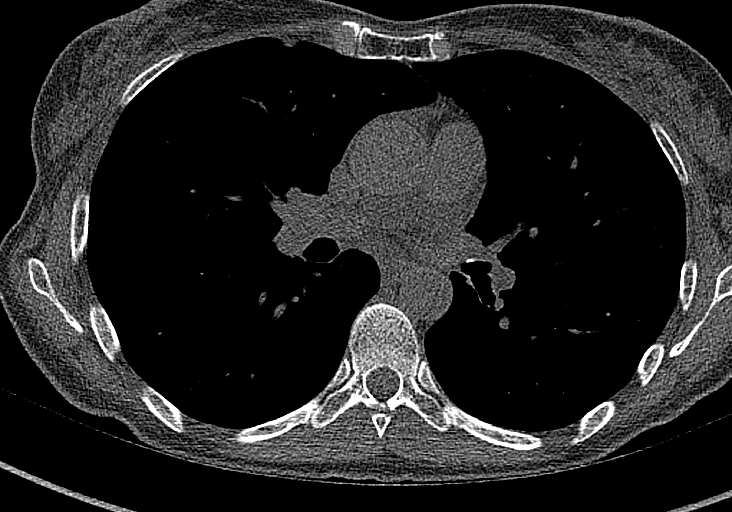

[12 of 20 positions shown; findings below may reference images not displayed]

FINDINGS: CORONARY CALCIUM SCORES:

Left Main: 0

LAD: 0

LCx: 0

RCA: 0

Total Agatston Score: 0

[HOSPITAL] percentile: 0

AORTA MEASUREMENTS:

Ascending Aorta: 29 mm

Descending Aorta: 20 mm

OTHER FINDINGS:

Heart is normal size. Aorta normal caliber. No adenopathy. No
confluent opacities or effusions. Imaging into the upper abdomen
demonstrates no acute findings. Chest wall soft tissues are
unremarkable. No acute bony abnormality.
IMPRESSION: No visible coronary artery calcifications. Total coronary calcium
score of 0.

No acute or significant extracardiac abnormality.

## 2022-10-30 DIAGNOSIS — M546 Pain in thoracic spine: Secondary | ICD-10-CM | POA: Diagnosis not present

## 2022-10-30 DIAGNOSIS — H2513 Age-related nuclear cataract, bilateral: Secondary | ICD-10-CM | POA: Diagnosis not present

## 2022-10-30 DIAGNOSIS — H18452 Nodular corneal degeneration, left eye: Secondary | ICD-10-CM | POA: Diagnosis not present

## 2022-10-30 DIAGNOSIS — M9902 Segmental and somatic dysfunction of thoracic region: Secondary | ICD-10-CM | POA: Diagnosis not present

## 2022-10-30 DIAGNOSIS — M542 Cervicalgia: Secondary | ICD-10-CM | POA: Diagnosis not present

## 2022-10-30 DIAGNOSIS — M9901 Segmental and somatic dysfunction of cervical region: Secondary | ICD-10-CM | POA: Diagnosis not present

## 2022-11-01 ENCOUNTER — Encounter: Payer: Self-pay | Admitting: Neurology

## 2022-11-05 NOTE — Telephone Encounter (Signed)
I thought of an attention specialist when you told me ( or In understood perhaps incorrectly) that less sleep is associated with better alertness/ focus. That would mean testing for ADD/ ADHD as these traits can have paradoxical responses to sleep (and to stimulants ). You tell me now that there is no close correlation and a refer to attention specialists would not be indicated.  GI, Cardiology both deal with vagal nerve function on gastric emptying and acid production and heart rate. I have no means for testing vagal nerve function in the sleep lab / office.   CD

## 2022-11-07 DIAGNOSIS — E785 Hyperlipidemia, unspecified: Secondary | ICD-10-CM | POA: Diagnosis not present

## 2022-11-07 DIAGNOSIS — M810 Age-related osteoporosis without current pathological fracture: Secondary | ICD-10-CM | POA: Diagnosis not present

## 2022-11-13 DIAGNOSIS — M9901 Segmental and somatic dysfunction of cervical region: Secondary | ICD-10-CM | POA: Diagnosis not present

## 2022-11-13 DIAGNOSIS — M546 Pain in thoracic spine: Secondary | ICD-10-CM | POA: Diagnosis not present

## 2022-11-13 DIAGNOSIS — M9902 Segmental and somatic dysfunction of thoracic region: Secondary | ICD-10-CM | POA: Diagnosis not present

## 2022-11-13 DIAGNOSIS — M542 Cervicalgia: Secondary | ICD-10-CM | POA: Diagnosis not present

## 2022-11-14 DIAGNOSIS — Z0000 Encounter for general adult medical examination without abnormal findings: Secondary | ICD-10-CM | POA: Diagnosis not present

## 2022-11-14 DIAGNOSIS — Z1339 Encounter for screening examination for other mental health and behavioral disorders: Secondary | ICD-10-CM | POA: Diagnosis not present

## 2022-11-14 DIAGNOSIS — Z1231 Encounter for screening mammogram for malignant neoplasm of breast: Secondary | ICD-10-CM | POA: Diagnosis not present

## 2022-11-14 DIAGNOSIS — Z1331 Encounter for screening for depression: Secondary | ICD-10-CM | POA: Diagnosis not present

## 2022-11-27 DIAGNOSIS — M542 Cervicalgia: Secondary | ICD-10-CM | POA: Diagnosis not present

## 2022-11-27 DIAGNOSIS — M9902 Segmental and somatic dysfunction of thoracic region: Secondary | ICD-10-CM | POA: Diagnosis not present

## 2022-11-27 DIAGNOSIS — M546 Pain in thoracic spine: Secondary | ICD-10-CM | POA: Diagnosis not present

## 2022-11-27 DIAGNOSIS — M9901 Segmental and somatic dysfunction of cervical region: Secondary | ICD-10-CM | POA: Diagnosis not present

## 2022-12-20 DIAGNOSIS — M9901 Segmental and somatic dysfunction of cervical region: Secondary | ICD-10-CM | POA: Diagnosis not present

## 2022-12-20 DIAGNOSIS — M9902 Segmental and somatic dysfunction of thoracic region: Secondary | ICD-10-CM | POA: Diagnosis not present

## 2022-12-20 DIAGNOSIS — M542 Cervicalgia: Secondary | ICD-10-CM | POA: Diagnosis not present

## 2022-12-20 DIAGNOSIS — M546 Pain in thoracic spine: Secondary | ICD-10-CM | POA: Diagnosis not present

## 2023-01-17 DIAGNOSIS — M9902 Segmental and somatic dysfunction of thoracic region: Secondary | ICD-10-CM | POA: Diagnosis not present

## 2023-01-17 DIAGNOSIS — M9901 Segmental and somatic dysfunction of cervical region: Secondary | ICD-10-CM | POA: Diagnosis not present

## 2023-01-17 DIAGNOSIS — M546 Pain in thoracic spine: Secondary | ICD-10-CM | POA: Diagnosis not present

## 2023-01-17 DIAGNOSIS — M542 Cervicalgia: Secondary | ICD-10-CM | POA: Diagnosis not present

## 2023-02-07 ENCOUNTER — Encounter (HOSPITAL_BASED_OUTPATIENT_CLINIC_OR_DEPARTMENT_OTHER): Payer: Self-pay | Admitting: Obstetrics & Gynecology

## 2023-02-07 ENCOUNTER — Ambulatory Visit (HOSPITAL_BASED_OUTPATIENT_CLINIC_OR_DEPARTMENT_OTHER): Payer: BC Managed Care – PPO | Admitting: Obstetrics & Gynecology

## 2023-02-07 VITALS — BP 105/71 | HR 72 | Ht 63.25 in | Wt 98.4 lb

## 2023-02-07 DIAGNOSIS — N942 Vaginismus: Secondary | ICD-10-CM

## 2023-02-07 DIAGNOSIS — M810 Age-related osteoporosis without current pathological fracture: Secondary | ICD-10-CM | POA: Diagnosis not present

## 2023-02-07 DIAGNOSIS — M62838 Other muscle spasm: Secondary | ICD-10-CM

## 2023-02-07 DIAGNOSIS — Z01419 Encounter for gynecological examination (general) (routine) without abnormal findings: Secondary | ICD-10-CM | POA: Diagnosis not present

## 2023-02-07 DIAGNOSIS — Z780 Asymptomatic menopausal state: Secondary | ICD-10-CM

## 2023-02-07 MED ORDER — ESTRADIOL 0.1 MG/GM VA CREA: TOPICAL_CREAM | VAGINAL | 3 refills | Status: AC

## 2023-02-07 NOTE — Progress Notes (Signed)
62 y.o. G0P0000 Married White or Caucasian female here for annual exam.  Having some brain fog and saw Dr. Vickey Huger.  Had a 1 and then a 3 night sleep study.  Hasn't really gotten to the bottom of her symptoms.  This is still bothersome to her.  Is doing some energy work and is hopeful.    Has seen Dorinda Hill, PCP.  Did blood work in August.  Reviewed lab work done at that time.    Denies vaginal bleeding.   Patient's last menstrual period was 11/04/2015.          Sexually active: No.  The current method of family planning is post menopausal status.    Exercising: yes.  Walking 1 hour every day Smoker:  no  Health Maintenance: Pap:  01/30/2022 Negative History of abnormal Pap:  no MMG:  11/08/2021 Negative Colonoscopy:  07/28/2021, follow up 10 years BMD:   11/08/2021 Early osteoporosis Screening Labs: 11/2022 with Dr. Nadene Rubins   reports that she has never smoked. She has been exposed to tobacco smoke. She has never used smokeless tobacco. She reports that she does not currently use alcohol. She reports that she does not use drugs.  Past Medical History:  Diagnosis Date   Arthritis    EARLY ARTHRITIS IN NECK   Cyst of left kidney 07/24/2014   simple appearing left mid kidney cyst   GERD (gastroesophageal reflux disease) 09/2011   Hiatal hernia    Hyperlipidemia    Internal hemorrhoids    Overactive bladder    Pericardial effusion 07/24/2014   trace anterior   Uterine fibroid 07/24/2014   2.4 cm subserosal left fundal    Past Surgical History:  Procedure Laterality Date   COLONOSCOPY     HEMORRHOID BANDING     HYSTEROSCOPY  03/30/2008   resction of polyps and D&C   TONSILLECTOMY AND ADENOIDECTOMY  age 37   WISDOM TOOTH EXTRACTION  age 77    Current Outpatient Medications  Medication Sig Dispense Refill   carboxymethylcellulose (REFRESH PLUS) 0.5 % SOLN 1 drop 2 (two) times daily as needed.      Eyelid Cleansers (AVENOVA) 0.01 % SOLN Apply topically 2 (two) times daily.      Wheat Dextrin (BENEFIBER PO) Take by mouth. 2 Tablespoons daily     No current facility-administered medications for this visit.    Family History  Problem Relation Age of Onset   Atrial fibrillation Father    Diabetes Father    Heart disease Brother    Breast cancer Paternal Aunt    Colon cancer Maternal Grandmother 12   Lung cancer Maternal Grandmother 15   Brain cancer Maternal Grandfather 13   Rheum arthritis Paternal Grandmother    Esophageal cancer Neg Hx    Stomach cancer Neg Hx    Liver disease Neg Hx    Crohn's disease Neg Hx    Rectal cancer Neg Hx     ROS: Constitutional: negative Genitourinary:negative  Exam:   BP 105/71 (BP Location: Right Arm, Patient Position: Sitting, Cuff Size: Normal)   Pulse 72   Ht 5' 3.25" (1.607 m)   Wt 98 lb 6.4 oz (44.6 kg)   LMP 11/04/2015   BMI 17.29 kg/m   Height: 5' 3.25" (160.7 cm)  General appearance: alert, cooperative and appears stated age Head: Normocephalic, without obvious abnormality, atraumatic Neck: no adenopathy, supple, symmetrical, trachea midline and thyroid normal to inspection and palpation Lungs: clear to auscultation bilaterally Breasts: normal appearance, no masses or  tenderness Heart: regular rate and rhythm Abdomen: soft, non-tender; bowel sounds normal; no masses,  no organomegaly Extremities: extremities normal, atraumatic, no cyanosis or edema Skin: Skin color, texture, turgor normal. No rashes or lesions Lymph nodes: Cervical, supraclavicular, and axillary nodes normal. No abnormal inguinal nodes palpated Neurologic: Grossly normal   Pelvic: External genitalia:  no lesions              Urethra:  normal appearing urethra with no masses, tenderness or lesions              Bartholins and Skenes: normal                 Vagina: normal appearing vagina with normal color and no discharge, no lesions              Cervix: no lesions              Pap taken: No. Bimanual Exam:  Uterus:  normal size,  contour, position, consistency, mobility, non-tender              Adnexa: normal adnexa and no mass, fullness, tenderness               Rectovaginal: Confirms               Anus:  normal sphincter tone, no lesions  Chaperone, Ina Homes, CMA, was present for exam.  Assessment/Plan: 1. Well woman exam with routine gynecological exam - Pap smear neg with neg HR 2023 - Mammogram has been done this year.  Updated mammogram requested. - Colonoscopy 2023. - Bone mineral density 2023 - lab work done with PCP, Dr. Nadene Rubins - vaccines reviewed/updated  2. Postmenopausal  3. Age-related osteoporosis without current pathological fracture - taking Vit D.  Will repeat next year.    4. Levator spasm - estradiol (ESTRACE) 0.1 MG/GM vaginal cream; Place 1 gram vaginally twice weekly  Dispense: 30 g; Refill: 3  5. Vaginismus

## 2023-02-12 DIAGNOSIS — M9901 Segmental and somatic dysfunction of cervical region: Secondary | ICD-10-CM | POA: Diagnosis not present

## 2023-02-12 DIAGNOSIS — M546 Pain in thoracic spine: Secondary | ICD-10-CM | POA: Diagnosis not present

## 2023-02-12 DIAGNOSIS — M9902 Segmental and somatic dysfunction of thoracic region: Secondary | ICD-10-CM | POA: Diagnosis not present

## 2023-02-12 DIAGNOSIS — M542 Cervicalgia: Secondary | ICD-10-CM | POA: Diagnosis not present

## 2023-02-22 ENCOUNTER — Encounter: Payer: Self-pay | Admitting: Sports Medicine

## 2023-02-22 ENCOUNTER — Other Ambulatory Visit (INDEPENDENT_AMBULATORY_CARE_PROVIDER_SITE_OTHER): Payer: BC Managed Care – PPO

## 2023-02-22 ENCOUNTER — Ambulatory Visit: Payer: BC Managed Care – PPO | Admitting: Sports Medicine

## 2023-02-22 DIAGNOSIS — M62838 Other muscle spasm: Secondary | ICD-10-CM | POA: Diagnosis not present

## 2023-02-22 DIAGNOSIS — G8929 Other chronic pain: Secondary | ICD-10-CM

## 2023-02-22 DIAGNOSIS — G4486 Cervicogenic headache: Secondary | ICD-10-CM

## 2023-02-22 DIAGNOSIS — M542 Cervicalgia: Secondary | ICD-10-CM

## 2023-02-22 DIAGNOSIS — M5030 Other cervical disc degeneration, unspecified cervical region: Secondary | ICD-10-CM | POA: Diagnosis not present

## 2023-02-22 DIAGNOSIS — M25511 Pain in right shoulder: Secondary | ICD-10-CM | POA: Diagnosis not present

## 2023-02-22 NOTE — Progress Notes (Signed)
Patient says that she has had pain in the right side of her neck and right shoulder since May 2020. She says that around 2022 she tried 6 months of physical therapy as she was having headaches in the back of the head, and those did improve. She says that she has been focusing on improving her posture which seems to help. She said that she has gone to a chiropractor for 6-9 months, and she does Body Pump classes at the gym. In these classes she says that she cannot lift heavy weights overhead as she will have shoulder pain and feelings of "grogginess and fogginess." She says that after exercise these feelings last about 1 hour, but she will also have these feelings in the mornings where she "can't think," which last anywhere from 1 hour to a full day.

## 2023-02-22 NOTE — Progress Notes (Signed)
Laurie Griffith - 62 y.o. female MRN [id number]  Date of birth: 1960-06-13  Office Visit Note: Visit Date: 02/22/2023 PCP: Melida Quitter, MD Referred by: Melida Quitter, MD  Subjective: Chief Complaint  Patient presents with   Neck - Pain   Right Shoulder - Pain   HPI: Laurie Griffith is a pleasant 62 y.o. female who presents today for cervical and right-sided shoulder pain that is associated with headache, fogginess.  Laurie Griffith has been dealing with right-sided neck and shoulder pain with brain fogginess, popping in the right ear, some unsteadiness beginning around May 2020.  She has seen her primary care physician for this as well as 2 separate neurologist with multiple sleep studies and lab workup were largely nondiagnostic.  Back in 2022 she did do 6 months of physical therapy which did help improve her symptoms some but never went away fully.  By 05/30/2018 she never had any issues with headaches.  When she lifts her arm above her head or doing shoulder press she will have a pressure pain in her neck and shoulder as well as having sensation within the ear.  She does have some unsteadiness on her feet for the first few hours of the morning and has noticed a mild issue with her balance over the past 3-4 months.  She is not taking any medications or over-the-counter anti-inflammatories for her symptoms.  Note reviewed from Neurology, Dr. Vickey Huger, 06/08/22 for brain fog - has had multiple sleep studies.  They were considering HST study.   Recent studies, reviewed:  Lab Results  Component Value Date   TSH 1.400 01/30/2022   Last vitamin D Lab Results  Component Value Date   VD25OH 47.1 01/30/2022   Lab Results  Component Value Date   VITAMINB12 >2000 (H) 01/30/2022   Pertinent ROS were reviewed with the patient and found to be negative unless otherwise specified above in HPI.   Assessment & Plan: Visit Diagnoses:  1. Cervical pain   2. Chronic right shoulder pain   3. Trapezius  muscle spasm   4. Cervicogenic headache   5. DDD (degenerative disc disease), cervical    Plan: I had a good discussion with Laurie Griffith regarding the possible etiology of her right neck and trapezius/shoulder pain with concomitant cervicogenic headache as well as fogginess/ear popping.  She has had a fairly extensive workup with her primary care as well as 2 separate neurologists.  There could be some association with her vagal nerve as well.  From a musculoskeletal perspective, she does have right trapezius hypertonicity as well as some foraminal narrowing noted on her x-rays. She is reporting some mild unsteadiness or balance issues over the last few months which does slightly increase my suspicion for possible cervical myelopathy although the remainder of her exam does not strongly correlate with this.  I think she would certainly benefit from osteopathic manipulative treatment for the cervical spine, paraspinals and trapezius.  I will send a referral to Dr. Aleen Sells for this.  She will continue to work on her postural changes and scapular retraction.  Okay to use over-the-counter anti-inflammatories only as needed.  I did offer a one-time trigger point injection into the right trapezius, but she did decline this today.  I would like to see how she responds OMT for a few sessions and she will follow-up back with me in about 6 weeks.  If she is not having improvement, we did discuss possible cervical spine MRI.   Follow-up: Return  in about 6 weeks (around 04/05/2023) for for R-neck/trap/headache.   Meds & Orders: No orders of the defined types were placed in this encounter.   Orders Placed This Encounter  Procedures   XR Shoulder Right   XR Cervical Spine 2 or 3 views   AMB referral to sports medicine     Procedures: No procedures performed      Clinical History: No specialty comments available.  She reports that she has never smoked. She has been exposed to tobacco smoke. She has never used  smokeless tobacco. No results for input(s): "HGBA1C", "LABURIC" in the last 8760 hours.  Objective:   Vital Signs: LMP 11/04/2015   Physical Exam  Gen: Well-appearing, in no acute distress; non-toxic CV: Well-perfused. Warm.  Resp: Breathing unlabored on room air; no wheezing. Psych: Fluid speech in conversation; appropriate affect; normal thought process Neuro: Sensation intact throughout. No gross coordination deficits.   Ortho Exam - Cervical: Fine spinous process TTP.  There is full range with flexion and extension although extension does worsen her pain.  Spurling's test does reproduce pain into the neck and shoulder but no radicular symptoms going down past the neck.  There is 1+ triceps, biceps and brachial radialis bilaterally.  No upper extremity weakness in the cervical myotomes.  - Right shoulder: No bony TTP.  There is right-sided trapezius hypertonicity and slight elevation compared to the contralateral side.  Full range of motion in all planes.  5/5 strength of rotator cuff testing.  Imaging: XR Cervical Spine 2 or 3 views  Result Date: 02/22/2023 2 views of the cervical spine including AP and lateral were ordered and reviewed by myself today.  There is some mild degenerative disc disease at the C5-C6 and C6/C7 level.  There is some foraminal narrowing likely in the posterior aspect of the spine from C6-C7.  No significant degenerative changes.  There is no anterior/retrograde listhesis.  Bilateral cervical rib noted on AP view.  XR Shoulder Right  Result Date: 02/22/2023 3 views of the right shoulder including AP, scapular Y and axial view were ordered and reviewed by myself.  Humeral head is well located within the glenohumeral joint.  There is no significant AC joint or glenohumeral joint arthritis.  No acute fracture or otherwise bony abnormality noted.   Past Medical/Family/Surgical/Social History: Medications & Allergies reviewed per EMR, new medications  updated. Patient Active Problem List   Diagnosis Date Noted   Severe morning sleep inertia 06/08/2022   Brain fog 06/08/2022   Hemorrhoids 11/08/2016   Irritable bowel syndrome 03/17/2016   Constipation 03/17/2016   GERD (gastroesophageal reflux disease) 03/08/2015   Past Medical History:  Diagnosis Date   Arthritis    EARLY ARTHRITIS IN NECK   Cyst of left kidney 07/24/2014   simple appearing left mid kidney cyst   GERD (gastroesophageal reflux disease) 09/2011   Hiatal hernia    Hyperlipidemia    Internal hemorrhoids    Overactive bladder    Pericardial effusion 07/24/2014   trace anterior   Uterine fibroid 07/24/2014   2.4 cm subserosal left fundal   Family History  Problem Relation Age of Onset   Atrial fibrillation Father    Diabetes Father    Heart disease Brother    Breast cancer Paternal Aunt    Colon cancer Maternal Grandmother 25   Lung cancer Maternal Grandmother 78   Brain cancer Maternal Grandfather 66   Rheum arthritis Paternal Grandmother    Esophageal cancer Neg Hx  Stomach cancer Neg Hx    Liver disease Neg Hx    Crohn's disease Neg Hx    Rectal cancer Neg Hx    Past Surgical History:  Procedure Laterality Date   COLONOSCOPY     HEMORRHOID BANDING     HYSTEROSCOPY  03/30/2008   resction of polyps and D&C   TONSILLECTOMY AND ADENOIDECTOMY  age 55   WISDOM TOOTH EXTRACTION  age 56   Social History   Occupational History   Occupation: [employer]    Comment: Scientist, clinical (histocompatibility and immunogenetics) Group  Tobacco Use   Smoking status: Never    Passive exposure: Past (25 YEARS AGO,WITH IN-LAWS)   Smokeless tobacco: Never  Vaping Use   Vaping status: Never Used  Substance and Sexual Activity   Alcohol use: Not Currently   Drug use: No   Sexual activity: Not Currently    Birth control/protection: Post-menopausal

## 2023-02-27 NOTE — Progress Notes (Unsigned)
    Laurie Griffith D.[facility] [address] [place] [address] Phone: [phone]   Assessment and Plan:     There are no diagnoses linked to this encounter.  ***   Pertinent previous records reviewed include ***    Follow Up: ***     Subjective:   I, Laurie Griffith, am serving as a Neurosurgeon for Doctor Richardean Sale  Chief Complaint: right shoulder pain   HPI:   02/28/2023 Patient is a 62 year old female with concerns of right shoulder pain. Patient states   Relevant Historical Information: ***  Additional pertinent review of systems negative.   Current Outpatient Medications:    carboxymethylcellulose (REFRESH PLUS) 0.5 % SOLN, 1 drop 2 (two) times daily as needed. , Disp: , Rfl:    estradiol (ESTRACE) 0.1 MG/GM vaginal cream, Place 1 gram vaginally twice weekly, Disp: 30 g, Rfl: 3   Eyelid Cleansers (AVENOVA) 0.01 % SOLN, Apply topically 2 (two) times daily., Disp: , Rfl:    Wheat Dextrin (BENEFIBER PO), Take by mouth. 2 Tablespoons daily, Disp: , Rfl:    Objective:     There were no vitals filed for this visit.    There is no height or weight on file to calculate BMI.    Physical Exam:    ***   Electronically signed by:  Laurie Griffith D.[facility] 7:44 AM 02/27/23

## 2023-02-28 ENCOUNTER — Ambulatory Visit: Payer: BC Managed Care – PPO | Admitting: Sports Medicine

## 2023-02-28 VITALS — BP 108/78 | HR 67 | Ht 63.0 in | Wt 100.0 lb

## 2023-02-28 DIAGNOSIS — M542 Cervicalgia: Secondary | ICD-10-CM | POA: Diagnosis not present

## 2023-02-28 DIAGNOSIS — M9908 Segmental and somatic dysfunction of rib cage: Secondary | ICD-10-CM | POA: Diagnosis not present

## 2023-02-28 DIAGNOSIS — F519 Sleep disorder not due to a substance or known physiological condition, unspecified: Secondary | ICD-10-CM

## 2023-02-28 DIAGNOSIS — S161XXA Strain of muscle, fascia and tendon at neck level, initial encounter: Secondary | ICD-10-CM

## 2023-02-28 DIAGNOSIS — M222X2 Patellofemoral disorders, left knee: Secondary | ICD-10-CM

## 2023-02-28 DIAGNOSIS — M9901 Segmental and somatic dysfunction of cervical region: Secondary | ICD-10-CM

## 2023-02-28 DIAGNOSIS — M9902 Segmental and somatic dysfunction of thoracic region: Secondary | ICD-10-CM

## 2023-02-28 NOTE — Patient Instructions (Signed)
Neck and knee HEP Activity as tolerated 4 week follow up

## 2023-03-07 DIAGNOSIS — Z681 Body mass index (BMI) 19 or less, adult: Secondary | ICD-10-CM | POA: Diagnosis not present

## 2023-03-07 DIAGNOSIS — Z713 Dietary counseling and surveillance: Secondary | ICD-10-CM | POA: Diagnosis not present

## 2023-04-02 DIAGNOSIS — M546 Pain in thoracic spine: Secondary | ICD-10-CM | POA: Diagnosis not present

## 2023-04-02 DIAGNOSIS — M9902 Segmental and somatic dysfunction of thoracic region: Secondary | ICD-10-CM | POA: Diagnosis not present

## 2023-04-02 DIAGNOSIS — M542 Cervicalgia: Secondary | ICD-10-CM | POA: Diagnosis not present

## 2023-04-02 DIAGNOSIS — M9901 Segmental and somatic dysfunction of cervical region: Secondary | ICD-10-CM | POA: Diagnosis not present

## 2023-04-18 DIAGNOSIS — F411 Generalized anxiety disorder: Secondary | ICD-10-CM | POA: Diagnosis not present

## 2023-04-19 ENCOUNTER — Encounter: Payer: Self-pay | Admitting: Internal Medicine

## 2023-04-19 DIAGNOSIS — G479 Sleep disorder, unspecified: Secondary | ICD-10-CM | POA: Diagnosis not present

## 2023-04-19 DIAGNOSIS — M9903 Segmental and somatic dysfunction of lumbar region: Secondary | ICD-10-CM | POA: Diagnosis not present

## 2023-04-23 ENCOUNTER — Ambulatory Visit: Payer: BC Managed Care – PPO | Admitting: Sports Medicine

## 2023-04-24 DIAGNOSIS — F411 Generalized anxiety disorder: Secondary | ICD-10-CM | POA: Diagnosis not present

## 2023-05-04 DIAGNOSIS — F411 Generalized anxiety disorder: Secondary | ICD-10-CM | POA: Diagnosis not present

## 2023-05-18 DIAGNOSIS — F411 Generalized anxiety disorder: Secondary | ICD-10-CM | POA: Diagnosis not present

## 2023-05-19 DIAGNOSIS — F411 Generalized anxiety disorder: Secondary | ICD-10-CM | POA: Diagnosis not present

## 2023-05-25 DIAGNOSIS — F411 Generalized anxiety disorder: Secondary | ICD-10-CM | POA: Diagnosis not present

## 2023-05-30 ENCOUNTER — Encounter: Payer: Self-pay | Admitting: Neurology

## 2023-05-31 NOTE — Telephone Encounter (Signed)
   Dear Deloria Lair,  PS at [facility]  cannot set up a montage that would incorporate  brachial arterial flow into your PSG.   I would opt for the [facility] test and I am certainly most interested in the results.   Melvyn Novas, MD

## 2023-06-02 DIAGNOSIS — F411 Generalized anxiety disorder: Secondary | ICD-10-CM | POA: Diagnosis not present

## 2023-06-14 DIAGNOSIS — F411 Generalized anxiety disorder: Secondary | ICD-10-CM | POA: Diagnosis not present

## 2023-06-22 DIAGNOSIS — F411 Generalized anxiety disorder: Secondary | ICD-10-CM | POA: Diagnosis not present

## 2023-06-29 DIAGNOSIS — F411 Generalized anxiety disorder: Secondary | ICD-10-CM | POA: Diagnosis not present

## 2023-07-09 ENCOUNTER — Ambulatory Visit (INDEPENDENT_AMBULATORY_CARE_PROVIDER_SITE_OTHER): Admitting: Surgery

## 2023-07-09 ENCOUNTER — Encounter: Payer: Self-pay | Admitting: Surgery

## 2023-07-09 VITALS — BP 122/83 | HR 66 | Temp 98.2°F | Ht 63.0 in | Wt 101.0 lb

## 2023-07-09 DIAGNOSIS — G540 Brachial plexus disorders: Secondary | ICD-10-CM | POA: Diagnosis not present

## 2023-07-09 NOTE — Progress Notes (Signed)
 [facility]  Patient name: Laurie Griffith MRN: [id number] DOB: [birth year] Sex: female   REQUESTING PROVIDER:    Dr. Tedra Senegal   REASON FOR CONSULT:    TOS  HISTORY OF PRESENT ILLNESS:   Laurie Griffith is a 63 y.o. female, who is here today for evaluation of bilateral cervical ribs.  She has seen multiple physicians recently.  Her biggest complaint has been that of waking up in the morning groggy and with brain fog.  This has been going on for several years.  She was recently out at [facility] and she was found to have her bilateral cervical ribs as well as loss of pulses with arm elevation.  She also states that when she was exercising historically, her shoulder press would cause her fatigue and also some issues on her face.  She denies any arm swelling or arm numbness.  She has developed a device that can keep her arms from raising up while she is sleeping and has subjectively noticed that her brain fog is less when she does this.  She has been undergoing a sleep study at [facility].  Full results of the study are not completed.  She has undergone chiropractic procedures that have been of minimal benefit.  PAST MEDICAL HISTORY    Past Medical History:  Diagnosis Date   Arthritis    EARLY ARTHRITIS IN NECK   Cyst of left kidney 07/24/2014   simple appearing left mid kidney cyst   GERD (gastroesophageal reflux disease) 09/2011   Hiatal hernia    Hyperlipidemia    Internal hemorrhoids    Overactive bladder    Pericardial effusion 07/24/2014   trace anterior   Uterine fibroid 07/24/2014   2.4 cm subserosal left fundal     FAMILY HISTORY   Family History  Problem Relation Age of Onset   Atrial fibrillation Father    Diabetes Father    Heart disease Brother    Breast cancer Paternal Aunt    Colon cancer Maternal Grandmother 46   Lung cancer Maternal Grandmother 51   Brain cancer Maternal Grandfather 5    Rheum arthritis Paternal Grandmother    Esophageal cancer Neg Hx    Stomach cancer Neg Hx    Liver disease Neg Hx    Crohn's disease Neg Hx    Rectal cancer Neg Hx     SOCIAL HISTORY:   Social History   Socioeconomic History   Marital status: Married    Spouse name: Not on file   Number of children: 0   Years of education: Not on file   Highest education level: Not on file  Occupational History   Occupation: [employer]    Comment: Scientist, clinical (histocompatibility and immunogenetics) Group  Tobacco Use   Smoking status: Never    Passive exposure: Past (25 YEARS AGO,WITH IN-LAWS)   Smokeless tobacco: Never  Vaping Use   Vaping status: Never Used  Substance and Sexual Activity   Alcohol use: Not Currently   Drug use: No   Sexual activity: Not Currently    Birth control/protection: Post-menopausal  Other Topics Concern   Not on file  Social History Narrative   Not on file   Social Drivers of Health   Financial Resource Griffith: Not on file  Food Insecurity: No Food Insecurity (10/20/2020)   Received from [facility], [facility]   Hunger Vital Sign    Worried About Running Out of Food in the Last Year: Never true  Ran Out of Food in the Last Year: Never true  Transportation Needs: Not on file  Physical Activity: Not on file  Stress: Not on file  Social Connections: Unknown (08/19/2021)   Received from [facility], [facility]   Social Network    Social Network: Not on file  Intimate Partner Violence: Unknown (07/12/2021)   Received from [facility], [facility]   HITS    Physically Hurt: Not on file    Insult or Talk Down To: Not on file    Threaten Physical Harm: Not on file    Scream or Curse: Not on file    ALLERGIES:    Allergies  Allergen Reactions   Doxycycline Nausea Only   Food     wheat   Nickel Other (See Comments)   Penicillins Rash   Sulfa Antibiotics Rash   Sulfasalazine Rash    CURRENT MEDICATIONS:    Current Outpatient Medications  Medication  Sig Dispense Refill   carboxymethylcellulose (REFRESH PLUS) 0.5 % SOLN 1 drop 2 (two) times daily as needed.      estradiol (ESTRACE) 0.1 MG/GM vaginal cream Place 1 gram vaginally twice weekly 30 g 3   Eyelid Cleansers (AVENOVA) 0.01 % SOLN Apply topically 2 (two) times daily.     Wheat Dextrin (BENEFIBER PO) Take by mouth. 2 Tablespoons daily     No current facility-administered medications for this visit.    REVIEW OF SYSTEMS:   [X]  denotes positive finding, [ ]  denotes negative finding Cardiac  Comments:  Chest pain or chest pressure:    Shortness of breath upon exertion:    Short of breath when lying flat:    Irregular heart rhythm:        Vascular    Pain in calf, thigh, or hip brought on by ambulation:    Pain in feet at night that wakes you up from your sleep:     Blood clot in your veins:    Leg swelling:         Pulmonary    Oxygen at home:    Productive cough:     Wheezing:         Neurologic    Sudden weakness in arms or legs:     Sudden numbness in arms or legs:     Sudden onset of difficulty speaking or slurred speech:    Temporary loss of vision in one eye:     Problems with dizziness:         Gastrointestinal    Blood in stool:      Vomited blood:         Genitourinary    Burning when urinating:     Blood in urine:        Psychiatric    Major depression:         Hematologic    Bleeding problems:    Problems with blood clotting too easily:        Skin    Rashes or ulcers:        Constitutional    Fever or chills:     PHYSICAL EXAM:   Vitals:   07/09/23 1154  BP: 122/83  Pulse: 66  Temp: 98.2 F (36.8 C)  SpO2: 98%  Weight: 101 lb (45.8 kg)  Height: 5\' 3"  (1.6 m)    GENERAL: The patient is a well-nourished female, in no acute distress. The vital signs are documented above. CARDIAC: There is a regular rate and rhythm.  VASCULAR:  Palpable radial pulses bilaterally to go away with arm abduction PULMONARY: Nonlabored  respirations ABDOMEN: Soft and non-tender with normal pitched bowel sounds.  MUSCULOSKELETAL: There are no major deformities or cyanosis. NEUROLOGIC: No focal weakness or paresthesias are detected. SKIN: There are no ulcers or rashes noted. PSYCHIATRIC: The patient has a normal affect.  STUDIES:   None  ASSESSMENT and PLAN   Bilateral cervical ribs: We outlined the anatomy in the chest and the thoracic outlet.  She does not have any symptoms of thoracic outlet either venous, arterial, or neurogenic.  The biggest question is whether or not she is having some form of cerebral ischemia from vertebral obstruction given her cervical ribs.  I have mended completing her sleep study and neurologic workup and then we can revisit the arterial issue.  I think the next step would be a CT angiogram of the neck and chest.  She is going to contact me when she has completed the studies.  I gave her my phone number.   Charlena Cross, MD, FACS [facility] [phone] Pager [phone]

## 2023-07-13 DIAGNOSIS — F411 Generalized anxiety disorder: Secondary | ICD-10-CM | POA: Diagnosis not present

## 2023-07-17 DIAGNOSIS — H9193 Unspecified hearing loss, bilateral: Secondary | ICD-10-CM | POA: Diagnosis not present

## 2023-07-17 DIAGNOSIS — H6123 Impacted cerumen, bilateral: Secondary | ICD-10-CM | POA: Diagnosis not present

## 2023-07-20 DIAGNOSIS — F411 Generalized anxiety disorder: Secondary | ICD-10-CM | POA: Diagnosis not present

## 2023-07-24 DIAGNOSIS — G479 Sleep disorder, unspecified: Secondary | ICD-10-CM | POA: Diagnosis not present

## 2023-07-27 DIAGNOSIS — F411 Generalized anxiety disorder: Secondary | ICD-10-CM | POA: Diagnosis not present

## 2023-08-03 DIAGNOSIS — G4720 Circadian rhythm sleep disorder, unspecified type: Secondary | ICD-10-CM | POA: Diagnosis not present

## 2023-08-03 DIAGNOSIS — R936 Abnormal findings on diagnostic imaging of limbs: Secondary | ICD-10-CM | POA: Diagnosis not present

## 2023-08-03 DIAGNOSIS — E785 Hyperlipidemia, unspecified: Secondary | ICD-10-CM | POA: Diagnosis not present

## 2023-08-03 DIAGNOSIS — Z0389 Encounter for observation for other suspected diseases and conditions ruled out: Secondary | ICD-10-CM | POA: Diagnosis not present

## 2023-08-10 DIAGNOSIS — F411 Generalized anxiety disorder: Secondary | ICD-10-CM | POA: Diagnosis not present

## 2023-08-31 DIAGNOSIS — F411 Generalized anxiety disorder: Secondary | ICD-10-CM | POA: Diagnosis not present

## 2023-09-07 ENCOUNTER — Telehealth (HOSPITAL_BASED_OUTPATIENT_CLINIC_OR_DEPARTMENT_OTHER): Payer: Self-pay

## 2023-09-08 DIAGNOSIS — F411 Generalized anxiety disorder: Secondary | ICD-10-CM | POA: Diagnosis not present

## 2023-09-10 DIAGNOSIS — R0989 Other specified symptoms and signs involving the circulatory and respiratory systems: Secondary | ICD-10-CM | POA: Diagnosis not present

## 2023-09-10 DIAGNOSIS — R936 Abnormal findings on diagnostic imaging of limbs: Secondary | ICD-10-CM | POA: Diagnosis not present

## 2023-09-10 DIAGNOSIS — G4720 Circadian rhythm sleep disorder, unspecified type: Secondary | ICD-10-CM | POA: Diagnosis not present

## 2023-09-10 DIAGNOSIS — E785 Hyperlipidemia, unspecified: Secondary | ICD-10-CM | POA: Diagnosis not present

## 2023-09-10 DIAGNOSIS — G540 Brachial plexus disorders: Secondary | ICD-10-CM | POA: Diagnosis not present

## 2023-09-10 NOTE — Telephone Encounter (Signed)
 Spoke with this patient about concerns of estradiol  dosage change. Patient states she has been on 0.5, but Rx was sent in for 1gm. She states that the 0.5 is currently working for her and does not remember discussing a change. I advised patient to continue with what she is doing. It only means that she will have more cream to last a little longer. Encouraged the patient to give us  a call if anything changes. tbw

## 2023-09-11 DIAGNOSIS — G459 Transient cerebral ischemic attack, unspecified: Secondary | ICD-10-CM | POA: Diagnosis not present

## 2023-09-11 DIAGNOSIS — R0683 Snoring: Secondary | ICD-10-CM | POA: Diagnosis not present

## 2023-09-12 DIAGNOSIS — E785 Hyperlipidemia, unspecified: Secondary | ICD-10-CM | POA: Diagnosis not present

## 2023-09-12 DIAGNOSIS — R936 Abnormal findings on diagnostic imaging of limbs: Secondary | ICD-10-CM | POA: Diagnosis not present

## 2023-09-12 DIAGNOSIS — G4720 Circadian rhythm sleep disorder, unspecified type: Secondary | ICD-10-CM | POA: Diagnosis not present

## 2023-09-14 DIAGNOSIS — F411 Generalized anxiety disorder: Secondary | ICD-10-CM | POA: Diagnosis not present

## 2023-09-21 DIAGNOSIS — F411 Generalized anxiety disorder: Secondary | ICD-10-CM | POA: Diagnosis not present

## 2023-09-23 ENCOUNTER — Encounter: Payer: Self-pay | Admitting: Surgery

## 2023-09-28 DIAGNOSIS — F411 Generalized anxiety disorder: Secondary | ICD-10-CM | POA: Diagnosis not present

## 2023-10-05 DIAGNOSIS — F411 Generalized anxiety disorder: Secondary | ICD-10-CM | POA: Diagnosis not present

## 2023-10-19 DIAGNOSIS — F411 Generalized anxiety disorder: Secondary | ICD-10-CM | POA: Diagnosis not present

## 2023-10-26 DIAGNOSIS — F411 Generalized anxiety disorder: Secondary | ICD-10-CM | POA: Diagnosis not present

## 2023-11-02 DIAGNOSIS — F411 Generalized anxiety disorder: Secondary | ICD-10-CM | POA: Diagnosis not present

## 2023-11-09 DIAGNOSIS — L821 Other seborrheic keratosis: Secondary | ICD-10-CM | POA: Diagnosis not present

## 2023-11-09 DIAGNOSIS — L280 Lichen simplex chronicus: Secondary | ICD-10-CM | POA: Diagnosis not present

## 2023-11-09 DIAGNOSIS — D2371 Other benign neoplasm of skin of right lower limb, including hip: Secondary | ICD-10-CM | POA: Diagnosis not present

## 2023-11-09 DIAGNOSIS — L814 Other melanin hyperpigmentation: Secondary | ICD-10-CM | POA: Diagnosis not present

## 2023-11-15 DIAGNOSIS — Z1231 Encounter for screening mammogram for malignant neoplasm of breast: Secondary | ICD-10-CM | POA: Diagnosis not present

## 2023-11-19 DIAGNOSIS — R7303 Prediabetes: Secondary | ICD-10-CM | POA: Diagnosis not present

## 2023-11-19 DIAGNOSIS — E785 Hyperlipidemia, unspecified: Secondary | ICD-10-CM | POA: Diagnosis not present

## 2023-11-19 DIAGNOSIS — M810 Age-related osteoporosis without current pathological fracture: Secondary | ICD-10-CM | POA: Diagnosis not present

## 2023-11-29 DIAGNOSIS — Z0000 Encounter for general adult medical examination without abnormal findings: Secondary | ICD-10-CM | POA: Diagnosis not present

## 2023-11-29 DIAGNOSIS — R82998 Other abnormal findings in urine: Secondary | ICD-10-CM | POA: Diagnosis not present

## 2023-11-29 DIAGNOSIS — Z1339 Encounter for screening examination for other mental health and behavioral disorders: Secondary | ICD-10-CM | POA: Diagnosis not present

## 2023-11-29 DIAGNOSIS — Z1331 Encounter for screening for depression: Secondary | ICD-10-CM | POA: Diagnosis not present

## 2023-11-29 DIAGNOSIS — I779 Disorder of arteries and arterioles, unspecified: Secondary | ICD-10-CM | POA: Diagnosis not present

## 2023-11-30 DIAGNOSIS — F411 Generalized anxiety disorder: Secondary | ICD-10-CM | POA: Diagnosis not present

## 2023-12-26 DIAGNOSIS — H52203 Unspecified astigmatism, bilateral: Secondary | ICD-10-CM | POA: Diagnosis not present

## 2023-12-26 DIAGNOSIS — H1789 Other corneal scars and opacities: Secondary | ICD-10-CM | POA: Diagnosis not present

## 2024-01-04 DIAGNOSIS — R0689 Other abnormalities of breathing: Secondary | ICD-10-CM | POA: Diagnosis not present

## 2024-01-04 DIAGNOSIS — R4181 Age-related cognitive decline: Secondary | ICD-10-CM | POA: Diagnosis not present

## 2024-01-11 ENCOUNTER — Encounter (HOSPITAL_BASED_OUTPATIENT_CLINIC_OR_DEPARTMENT_OTHER): Payer: Self-pay | Admitting: Obstetrics & Gynecology

## 2024-02-11 ENCOUNTER — Encounter: Payer: Self-pay | Admitting: Radiology

## 2024-02-22 ENCOUNTER — Ambulatory Visit (HOSPITAL_BASED_OUTPATIENT_CLINIC_OR_DEPARTMENT_OTHER): Payer: BC Managed Care – PPO | Admitting: Obstetrics & Gynecology

## 2024-04-20 ENCOUNTER — Encounter (HOSPITAL_BASED_OUTPATIENT_CLINIC_OR_DEPARTMENT_OTHER): Payer: Self-pay | Admitting: Obstetrics & Gynecology

## 2024-05-12 ENCOUNTER — Ambulatory Visit (HOSPITAL_BASED_OUTPATIENT_CLINIC_OR_DEPARTMENT_OTHER): Admitting: Obstetrics & Gynecology

## 2024-05-12 ENCOUNTER — Encounter (HOSPITAL_BASED_OUTPATIENT_CLINIC_OR_DEPARTMENT_OTHER): Payer: Self-pay

## 2024-05-13 ENCOUNTER — Telehealth (HOSPITAL_BASED_OUTPATIENT_CLINIC_OR_DEPARTMENT_OTHER): Payer: Self-pay

## 2024-05-13 NOTE — Telephone Encounter (Signed)
 Left message to call Laurie Griffith at [phone].  Call to patient to reschedule aex.

## 2024-06-16 ENCOUNTER — Ambulatory Visit (HOSPITAL_BASED_OUTPATIENT_CLINIC_OR_DEPARTMENT_OTHER): Payer: Self-pay | Admitting: Obstetrics & Gynecology
